# Patient Record
Sex: Female | Born: 1940 | Race: White | Hispanic: No | Marital: Married | Smoking: Former smoker
Health system: Southern US, Community
[De-identification: ages and names within clinical notes are randomized; demographics above are authoritative.]

## PROBLEM LIST (undated history)

## (undated) DIAGNOSIS — E785 Hyperlipidemia, unspecified: Secondary | ICD-10-CM

## (undated) DIAGNOSIS — K635 Polyp of colon: Secondary | ICD-10-CM

## (undated) DIAGNOSIS — Z90710 Acquired absence of both cervix and uterus: Secondary | ICD-10-CM

## (undated) DIAGNOSIS — I10 Essential (primary) hypertension: Secondary | ICD-10-CM

## (undated) DIAGNOSIS — M1990 Unspecified osteoarthritis, unspecified site: Secondary | ICD-10-CM

## (undated) HISTORY — PX: COLONOSCOPY: SHX174

## (undated) HISTORY — DX: Acquired absence of both cervix and uterus: Z90.710

## (undated) HISTORY — DX: Hyperlipidemia, unspecified: E78.5

## (undated) HISTORY — PX: ABDOMINAL HYSTERECTOMY: SHX81

## (undated) HISTORY — PX: TONSILLECTOMY: SUR1361

## (undated) HISTORY — PX: MOHS SURGERY: SUR867

## (undated) HISTORY — DX: Essential (primary) hypertension: I10

---

## 1999-08-30 ENCOUNTER — Encounter: Admission: RE | Admit: 1999-08-30 | Discharge: 1999-11-28 | Payer: Self-pay | Admitting: Family Medicine

## 1999-09-07 ENCOUNTER — Encounter: Payer: Self-pay | Admitting: Family Medicine

## 1999-09-07 ENCOUNTER — Encounter: Admission: RE | Admit: 1999-09-07 | Discharge: 1999-09-07 | Payer: Self-pay | Admitting: Family Medicine

## 2000-09-26 ENCOUNTER — Encounter: Payer: Self-pay | Admitting: Family Medicine

## 2000-09-26 ENCOUNTER — Encounter: Admission: RE | Admit: 2000-09-26 | Discharge: 2000-09-26 | Payer: Self-pay | Admitting: Family Medicine

## 2001-09-29 ENCOUNTER — Encounter: Payer: Self-pay | Admitting: Family Medicine

## 2001-09-29 ENCOUNTER — Encounter: Admission: RE | Admit: 2001-09-29 | Discharge: 2001-09-29 | Payer: Self-pay | Admitting: Family Medicine

## 2002-10-09 ENCOUNTER — Encounter: Payer: Self-pay | Admitting: Family Medicine

## 2002-10-09 ENCOUNTER — Encounter: Admission: RE | Admit: 2002-10-09 | Discharge: 2002-10-09 | Payer: Self-pay | Admitting: Family Medicine

## 2003-06-18 ENCOUNTER — Ambulatory Visit (HOSPITAL_COMMUNITY): Admission: RE | Admit: 2003-06-18 | Discharge: 2003-06-18 | Payer: Self-pay | Admitting: Family Medicine

## 2003-10-22 ENCOUNTER — Encounter: Admission: RE | Admit: 2003-10-22 | Discharge: 2003-10-22 | Payer: Self-pay | Admitting: Family Medicine

## 2004-12-14 ENCOUNTER — Encounter: Admission: RE | Admit: 2004-12-14 | Discharge: 2004-12-14 | Payer: Self-pay | Admitting: Family Medicine

## 2006-01-03 ENCOUNTER — Encounter: Admission: RE | Admit: 2006-01-03 | Discharge: 2006-01-03 | Payer: Self-pay | Admitting: Family Medicine

## 2007-03-18 ENCOUNTER — Encounter: Admission: RE | Admit: 2007-03-18 | Discharge: 2007-03-18 | Payer: Self-pay | Admitting: Family Medicine

## 2008-05-04 ENCOUNTER — Encounter: Admission: RE | Admit: 2008-05-04 | Discharge: 2008-05-04 | Payer: Self-pay | Admitting: Family Medicine

## 2009-05-11 ENCOUNTER — Encounter: Admission: RE | Admit: 2009-05-11 | Discharge: 2009-05-11 | Payer: Self-pay | Admitting: Family Medicine

## 2010-06-12 ENCOUNTER — Other Ambulatory Visit: Payer: Self-pay | Admitting: Family Medicine

## 2010-06-12 DIAGNOSIS — Z1231 Encounter for screening mammogram for malignant neoplasm of breast: Secondary | ICD-10-CM

## 2010-06-12 DIAGNOSIS — Z1239 Encounter for other screening for malignant neoplasm of breast: Secondary | ICD-10-CM

## 2010-06-26 ENCOUNTER — Ambulatory Visit
Admission: RE | Admit: 2010-06-26 | Discharge: 2010-06-26 | Disposition: A | Discharge status: Home / Self Care | Payer: Medicare Other | Source: Ambulatory Visit | Attending: Family Medicine | Admitting: Family Medicine

## 2010-06-26 DIAGNOSIS — Z1231 Encounter for screening mammogram for malignant neoplasm of breast: Secondary | ICD-10-CM

## 2010-08-07 ENCOUNTER — Other Ambulatory Visit: Payer: Self-pay | Admitting: Dermatology

## 2011-08-28 ENCOUNTER — Other Ambulatory Visit: Payer: Self-pay | Admitting: Family Medicine

## 2011-08-28 DIAGNOSIS — Z1231 Encounter for screening mammogram for malignant neoplasm of breast: Secondary | ICD-10-CM

## 2011-09-12 ENCOUNTER — Ambulatory Visit
Admission: RE | Admit: 2011-09-12 | Discharge: 2011-09-12 | Disposition: A | Discharge status: Home / Self Care | Payer: Medicare Other | Source: Ambulatory Visit | Attending: Family Medicine | Admitting: Family Medicine

## 2011-09-12 DIAGNOSIS — Z1231 Encounter for screening mammogram for malignant neoplasm of breast: Secondary | ICD-10-CM

## 2011-09-13 ENCOUNTER — Other Ambulatory Visit: Payer: Self-pay | Admitting: Family Medicine

## 2011-09-13 DIAGNOSIS — R928 Other abnormal and inconclusive findings on diagnostic imaging of breast: Secondary | ICD-10-CM

## 2011-09-19 ENCOUNTER — Ambulatory Visit
Admission: RE | Admit: 2011-09-19 | Discharge: 2011-09-19 | Disposition: A | Discharge status: Home / Self Care | Payer: Medicare Other | Source: Ambulatory Visit | Attending: Family Medicine | Admitting: Family Medicine

## 2011-09-19 DIAGNOSIS — R928 Other abnormal and inconclusive findings on diagnostic imaging of breast: Secondary | ICD-10-CM

## 2011-12-05 ENCOUNTER — Encounter: Payer: Self-pay | Admitting: Sports Medicine

## 2011-12-05 ENCOUNTER — Ambulatory Visit (INDEPENDENT_AMBULATORY_CARE_PROVIDER_SITE_OTHER): Payer: Medicare Other | Admitting: Sports Medicine

## 2011-12-05 VITALS — BP 145/89 | Ht 66.0 in | Wt 178.0 lb

## 2011-12-05 DIAGNOSIS — M25511 Pain in right shoulder: Secondary | ICD-10-CM | POA: Insufficient documentation

## 2011-12-05 DIAGNOSIS — M7531 Calcific tendinitis of right shoulder: Secondary | ICD-10-CM | POA: Insufficient documentation

## 2011-12-05 DIAGNOSIS — M7530 Calcific tendinitis of unspecified shoulder: Secondary | ICD-10-CM

## 2011-12-05 DIAGNOSIS — M25519 Pain in unspecified shoulder: Secondary | ICD-10-CM

## 2011-12-05 MED ORDER — NITROGLYCERIN 0.2 MG/HR TD PT24: MEDICATED_PATCH | TRANSDERMAL | Status: DC

## 2011-12-05 NOTE — Assessment & Plan Note (Signed)
Patient clinically as well as diagnostically had signs of rotator cuff syndrome. Likely this is from the calcific changes of the supraspinatus and infraspinatus tendon. At this point patient is going to be given a home exercise program to try to strengthen the rotator cuff and increase range of motion. In addition patient will start nitroglycerin patch protocol to see if we can help with the small tear that is seen in the supraspinatus tendon. Patient will continue these in followup in 4-6 weeks for reevaluation. At that time we will try to rescan and make sure that there's some improvement. On physical exam patient was minorly week of the right side but overall had good range of motion. Hopefully at followup will show improvement in strength.

## 2011-12-05 NOTE — Progress Notes (Signed)
Patient ID: Tamara Anthony, female   DOB: Sep 26, 1940, 71 y.o.   MRN: [id number]  Subjective: 71 year old female coming in with right shoulder pain. This is a new patient visit. Patient states approximately 6 months ago she has noticed that she's had some right shoulder pain does seem to be worse at night. Stated that he used to wake her up when she would lay on that side. During the course of these recent months the pain seems to have progressed where it does catheter from time to time with certain movements as well. Patient denies having any weakness. Patient also denies having any type of injury that she can think of that has caused this. Patient also denies any history of any problems with the shoulder previously. Patient denies any radiation of the pain but states that the pain is deep when she does have it and has not taken any over the counter medications. Patient saw her primary care provider who did get x-ray films that were reviewed by me today. Patient states her primary care provider said there was a abnormality in that is why she was sent here today to be seen. Patient does play softball but pictures underhand most of the time as well as plays pickle ball but she is still able to do these activities.  Review of systems  Patient denies any fevers or chills has lost weight which she has been trying to do. Denies any swelling instability or stiffness of the shoulder, patient denies dizziness any neck pain shortness of breath or chest pain. Otherwise 14 point system review of systems is unremarkable.  Past Medical History  Diagnosis Date  . Diabetes mellitus   . Hypertension   . Hyperlipidemia    History reviewed. No pertinent past surgical history. History   Social History  . Marital Status: Married    Spouse Name: N/A    Number of Children: N/A  . Years of Education: N/A   Occupational History  . Not on file.   Social History Main Topics  . Smoking status: Never Smoker   . Smokeless  tobacco: Not on file  . Alcohol Use: 1.2 oz/week    2 Glasses of wine per week  . Drug Use: No  . Sexually Active: No   Other Topics Concern  . Not on file   Social History Narrative  . No narrative on file   Family History  Problem Relation Age of Onset  . Diabetes Mother   . Heart attack Mother   . Hyperlipidemia Mother   . Hypertension Mother   . Hypertension Father      physical exam: Filed Vitals:   12/05/11 0959  BP: 145/89   General: No apparent distress healthy female Patient is alert and oriented x3. Patient's mood is normal. Shoulder:right  Inspection reveals no abnormalities, atrophy or asymmetry. Palpation is normal with mild tenderness over AC joint but no TTP in bicipital groove. ROM is full in all planes. Rotator cuff strength weak but bilateral mostly supraspinatus on right No signs of impingement with negative Neer and Hawkin's tests, empty can. Speeds and Yergason's tests normal. No labral pathology noted with negative Obrien's, negative clunk and good stability. Normal scapular function observed. Mild painful arc but no drop arm sign. No apprehension sign   Radiology: Images were reviewed by me today. Patient does have an area of calcification that is superior and lateral to the acromion on the right shoulder appears to be calcium deposits in likely the supraspinatus tendon. Patient  shoulder though appears unremarkable with no signs of fracture or any gapping or arthritic changes.  Ultrasound: Ultrasound today did show that patient has significant amount of calcium deposits in the supraspinatus as well as infraspinatus tendon today. Patient also has some mild tearing still in the supraspinatus. Patient's infraspinatus appears to be intact. Patient's teres minor was unremarkable and patient subscapularis was unremarkable. Patient's bicep tendon did have some mild hypoechoic changes at the proximal area but did not show any signs of tearing.

## 2011-12-05 NOTE — Patient Instructions (Addendum)
Very nice to meet you You do have a tear in your rotator cuff with some calcium deposits.  Continue on your weight loss and control your diabetes.  We will start you on a rehab program. Only start with 10 reps of the exercises.  We will also start on nitro patch.   Wear 1/2 patch daily it may give you a headache.  We want to see you again in 6 weeks and we will rescan the tendon to make sure it is improving as well.

## 2012-01-16 ENCOUNTER — Ambulatory Visit (INDEPENDENT_AMBULATORY_CARE_PROVIDER_SITE_OTHER): Payer: Medicare Other | Admitting: Sports Medicine

## 2012-01-16 VITALS — BP 130/70 | Ht 66.0 in | Wt 175.0 lb

## 2012-01-16 DIAGNOSIS — M25519 Pain in unspecified shoulder: Secondary | ICD-10-CM

## 2012-01-16 DIAGNOSIS — M25511 Pain in right shoulder: Secondary | ICD-10-CM

## 2012-01-16 DIAGNOSIS — M7531 Calcific tendinitis of right shoulder: Secondary | ICD-10-CM

## 2012-01-16 DIAGNOSIS — M7530 Calcific tendinitis of unspecified shoulder: Secondary | ICD-10-CM

## 2012-01-16 NOTE — Patient Instructions (Addendum)
Thank you for coming in today, it was good to see you Continue your theraband exercises-start to do overhead exercises as well with the theraband Also do the same exercises with a one pound weight.  You can increase this up to 5 lbs as tolerated Continue the nitroglycerin patches Follow up in 6 weeks

## 2012-01-16 NOTE — Assessment & Plan Note (Signed)
Significantly improved. Supraspinatus tear shows interval healing from previous scan. Doppler shows increase in vascular activity within the tendonand some decreased calcification. Given her improvement will have her continue theraband and increase exercise to overhead exercise and add one pound weight to begin with, not to exceed 5 pounds for exercises She'll continue nitroglycerin patches Followup in 6 weeks

## 2012-01-16 NOTE — Progress Notes (Signed)
  Subjective:    Patient ID: Tamara Anthony, female    DOB: 05-29-1940, 71 y.o.   MRN: [id number]  HPI  1. Shoulder pain: Patient here for followup of right shoulder pain. Pain is a 75-80% improved since previous visit. She is continuing to do exercises with theraband, and feels like this has helped significantly. Previously it was painful for her to sleep on her shoulder but this does not bother her anymore. She is minimal pain with use. She is also been using nitroglycerin patches for a small supraspinatus tear that was seen on ultrasound previously. She denies any decreased range of motion of the shoulder, neck pain, pain radiating into the arm, numbness, or tingling.  Review of Systems For history of present illness    Objective:   Physical Exam Shoulder: Inspection reveals no abnormalities, atrophy or asymmetry. Palpation is normal with no tenderness over AC joint or bicipital groove. ROM is full in all planes. Rotator cuff strength 4.5/5 with testing of supraspinatus otherwise normal throughout. No signs of impingement with negative Neer and Hawkin's tests, empty can. No labral pathology noted with negative Obrien's, negative clunk and good stability. Normal scapular function observed. No painful arc and no drop arm sign. No apprehension sign   MSK ultrasound: MSK ultrasound demonstrated interval healing of previous supraspinatus tear. There is still a small gap but this is improved from previous scan. Color Doppler shows large amount of vascular activity within the tendon.  there are still calcifications within the tendon although this looks mildly improved.subscapularis, infraspinatus and teres minor are intact.     Assessment & Plan:

## 2012-01-16 NOTE — Assessment & Plan Note (Signed)
Pain level is low enough that she is not even using OTC NSAIDs at this time

## 2012-01-17 ENCOUNTER — Ambulatory Visit (INDEPENDENT_AMBULATORY_CARE_PROVIDER_SITE_OTHER): Payer: Medicare Other | Admitting: Gynecology

## 2012-01-17 ENCOUNTER — Encounter: Payer: Self-pay | Admitting: Gynecology

## 2012-01-17 VITALS — BP 128/76 | Ht 65.0 in | Wt 176.0 lb

## 2012-01-17 DIAGNOSIS — N8110 Cystocele, unspecified: Secondary | ICD-10-CM

## 2012-01-17 DIAGNOSIS — N814 Uterovaginal prolapse, unspecified: Secondary | ICD-10-CM

## 2012-01-17 DIAGNOSIS — N812 Incomplete uterovaginal prolapse: Secondary | ICD-10-CM | POA: Insufficient documentation

## 2012-01-17 DIAGNOSIS — N8111 Cystocele, midline: Secondary | ICD-10-CM

## 2012-01-17 DIAGNOSIS — N952 Postmenopausal atrophic vaginitis: Secondary | ICD-10-CM | POA: Insufficient documentation

## 2012-01-17 MED ORDER — ESTRADIOL 0.1 MG/GM VA CREA: 2.0000 g | TOPICAL_CREAM | VAGINAL | Status: DC

## 2012-01-17 NOTE — Progress Notes (Signed)
Patient is a 71 year old gravida 2 para 2 (2 normal spontaneous vaginal deliveries largest baby weighed 8-1/2 pounds) who was referred to our practice as a courtesy of Dr. Delorse Lek at [facility] at [place] as a result of patient's pelvic organ prolapse. Her primary physician is treating her for hyperlipidemia, hypertension and type 2 diabetes. Patient's mammogram was in 2013. Her last colonoscopy was 3 years ago patient stated that she had several colon polyps removed. Her last bone density study was reported to be normal in 2008 and she is overdue for followup study.  Patient is very active in sports and is sexually active and recently she noticed a bulge in her vagina when she was showering . She denies any true stress urinary incontinence but states that if her bladder is completely full and if she is on her way to the bathroom she may leak a little bit. She does not have to use a pad on a daily basis Patient denies any vaginal atrophy. Patient denies any prior abdominal or pelvic surgery.  Exam: Abdomen: Soft nontender no rebound or guarding Pelvic: Bartholin urethra Skene was within normal limits Vagina: Mild vaginal atrophy second-degree cystocele with first-degree uterine prolapse noted on Valsalva maneuver. No evidence of rectocele. Bimanual exam: Uterus small axial when no palpable masses or tenderness. Adnexa: No palpable masses or tenderness Rectal: No rectocele good sphincter tone  Q-tip angle tests (to determine angulation of the urethral vesical angle to determine if there is hypermobility of the urethra) was less than 30 (normal)  On Valsalva maneuver in the supine and erect position patient did not leak urine even when the cystocele was reduced. There was no evidence of enterocele on rectovaginal exam.  Assessment/plan: First degree uterine prolapse with second degree cystocele. Patient will be scheduled to undergo urodynamic evaluation in the office and  then we will schedule a transvaginal hysterectomy with bilateral salpingo-oophorectomy along with anterior colporrhaphy. She will be started on Estrace vaginal cream to improve the vaginal epithelium to allow better tissue dissection at time of her surgery. She will start Estrace vaginal cream one applicator before bedtime for one week and then she will continue twice a week until at time of her surgery. Risks benefits and pros and cons were discussed. Patient denies any family history of breast cancer in her mammograms are up-to-date. Literature information on all the above was provided for the patient and will sit down and over her operation was the urodynamic studies have been completed here in the office.

## 2012-01-17 NOTE — Patient Instructions (Signed)
Prolapse  Prolapse means the falling down, bulging, dropping, or drooping of a body part. Organs that commonly prolapse include the rectum, small intestine, bladder, urethra, vagina (birth canal), uterus (womb), and cervix. Prolapse occurs when the ligaments and muscle tissue around the rectum, bladder, and uterus are damaged or weakened.  CAUSES  This happens especially with:  Childbirth. Some women feel pelvic pressure or have trouble holding their urine right after childbirth, because of stretching and tearing of pelvic tissues. This generally gets better with time and the feeling usually goes away, but it may return with aging.   Chronic heavy lifting.   Aging.   Menopause, with loss of estrogen production weakening the pelvic ligaments and muscles.   Past pelvic surgery.   Obesity.   Chronic constipation.   Chronic cough.  Prolapse may affect a single organ, or several organs may prolapse at the same time. The front wall of the vagina holds up the bladder. The back wall holds up part of the lower intestine, or rectum. The uterus fills a spot in the middle. All these organs can be involved when the ligaments and muscles around the vagina relax too much. This often gets worse when women stop producing estrogen (menopause). SYMPTOMS  Uncontrolled loss of urine (incontinence) with cough, sneeze, straining, and exercise.   More force may be required to have a bowel movement, due to trapping of the stool.   When part of an organ bulges through the opening of the vagina, there is sometimes a feeling of heaviness or pressure. It may feel as though something is falling out. This sensation increases with coughing or bearing down.   If the organs protrude through the opening of the vagina and rub against the clothing, there may be soreness, ulcers, infection, pain, and bleeding.   Lower back pain.   Pushing in the upper or lower part of the vagina, to pass urine or have a bowel movement.     Problems having sexual intercourse.   Being unable to insert a tampon or applicator.  DIAGNOSIS  Usually, a physical exam is all that is needed to identify the problem. During the examination, you may be asked to cough and strain while lying down, sitting up, and standing up. Your caregiver will determine if more testing is required, such as bladder function tests. Some diagnoses are:  Cystocele: Bulging and falling of the bladder into the top of the vagina.   Rectocele: Part of the rectum bulging into the vagina.   Prolapse of the uterus: The uterus falls or drops into the vagina.   Enterocele: Bulging of the top of the vagina, after a hysterectomy (uterus removal), with the small intestine bulging into the vagina. A hernia in the top of the vagina.   Urethrocele: The urethra (urine carrying tube) bulging into the vagina.  TREATMENT  In most cases, prolapse needs to be treated only if it produces symptoms. If the symptoms are interfering with your usual daily or sexual activities, treatment may be necessary. The following are some measures that may be used to treat prolapse.  Estrogen may help elderly women with mild prolapse.   Kegel exercises may help mild cases of prolapse, by strengthening and tightening the muscles of the pelvic floor.   Pessaries are used in women who choose not to, or are unable to, have surgery. A pessary is a doughnut-shaped piece of plastic or rubber that is put into the vagina to keep the organs in place. This device must   be fitted by your caregiver. Your caregiver will also explain how to care for yourself with the pessary. If it works well for you, this may be the only treatment required.   Surgery is often the only form of treatment for more severe prolapses. There are different types of surgery available. You should discuss what the best procedure is for you. If the uterus is prolapsed, it may be removed (hysterectomy) as part of the surgical treatment.  Your caregiver will discuss the risks and benefits with you.   Uterine-vaginal suspension (surgery to hold up the organs) may be used, especially if you want to maintain your fertility.  No form of treatment is guaranteed to correct the prolapse or relieve the symptoms. HOME CARE INSTRUCTIONS   Wear a sanitary pad or absorbent product if you have incontinence of urine.   Avoid heavy lifting and straining with exercise and work.   Take over-the-counter pain medicine for minor discomfort.   Try taking estrogen or using estrogen vaginal cream.   Try Kegel exercises or use a pessary, before deciding to have surgery.   Do Kegel exercises after having a baby.  SEEK MEDICAL CARE IF:   Your symptoms interfere with your daily activities.   You need medicine to help with the discomfort.   You need to be fitted with a pessary.   You notice bleeding from the vagina.   You think you have ulcers or you notice ulcers on the cervix.   You have an oral temperature above 102 F (38.9 C).   You develop pain or blood with urination.   You have bleeding with a bowel movement.   The symptoms are interfering with your sex life.   You have urinary incontinence that interferes with your daily activities.   You lose urine with sexual intercourse.   You have a chronic cough.   You have chronic constipation.  Document Released: 11/04/2002 Document Revised: 04/19/2011 Document Reviewed: 01/02/2                                                                                            Cystocele Repair  A cystocele is a bulging, drooping hernia or break (rupture) of bladder tissue into the birth canal (vagina). This bulging or rupture occurs on the top front wall of the vagina. CAUSES  Cystocele is associated with weakness of the top front wall of the vagina due to stretching and tearing of the ligaments and muscles in the area. This is often the result of:  Multiple childbirths.    Continuous heavy lifting.   Chronic cough from asthma, emphysema, or smoking.   Being overweight.   Changes from aging.   Previous surgery in the vaginal area.   Menopause with loss of estrogen hormone and weakening of the ligaments and muscles around the bladder.  SYMPTOMS   Uncontrolled loss of urine (incontinence) with cough, sneeze, or exercise.   Pelvic pressure.   Frequency or urgency to urinate because of inability to completely empty the bladder.   Bladder infections.   Needing to push on the upper vagina to help yourself pass urine.  DIAGNOSIS  A cystocele can be diagnosed by doing a pelvic exam and observing the top of the vagina drooping or bulging into or out of the vagina. TREATMENT  Surgical options:  Cystocele repair is surgery that removes the hernia.   There are also different "sling" operations that may be used.  Discuss the different types of surgeries to repair a cystocele with your caregiver. Your caregiver will decide what type of surgery will be best in your case. Nonsurgical options:  Kegel exercises. This helps strengthen and tighten the muscles and tissue in and around the bladder and vagina. This may help with mild cases of cystocele.   A pessary may help the cystocele. A pessary is a plastic or rubber device that lifts the bladder into place. A pessary must be fitted by a doctor.   Tampons or diaphragms that lift the bladder into place are sometimes helpful with a minor or small cystocele.   Estrogen may help with mild cases in menopausal and aging women.  LET YOUR CAREGIVER KNOW ABOUT:   Allergies to food or medicine.   Medicines taken, including vitamins, herbs, eyedrops, over-the-counter medicines, and creams.   Use of steroids (by mouth or creams).   Previous problems with anesthetics or numbing medicines.   History of bleeding problems or blood clots.   Previous surgery.   Other health problems, including diabetes and kidney  problems.   Possibility of pregnancy, if this applies.  RISKS AND COMPLICATIONS  All surgery is associated with risks.  There are risks with a general anesthesia. You should discuss this with your caregiver.   With spinal or epidural anesthesia, there may be an area that is not numbed, and you could feel pain.   Headache could occur with a spinal or epidural anesthetic.   The catheter you will have after surgery may not work properly or may get blocked and need to be replaced.   Excessive bleeding.   Infection.   Injury to surrounding structures.   Recurrence of the cystocele.   Surgery may not get rid of your symptoms.  BEFORE THE PROCEDURE   Do not take aspirin or blood thinners for 1 week prior to surgery, unless instructed otherwise.   Do not eat or drink anything after midnight the night before surgery.   Let your caregiver know if you develop a cold or other infectious problems prior to surgery.   If being admitted the day of surgery, you should be present 1 hour prior to your procedure or as directed by your caregiver.   Plan and arrange for help when you go home from the hospital.   If you smoke, do not smoke for at least 2 weeks before the surgery.   Do not drink any alcohol for 3 days before the surgery.  PROCEDURE  You will be given an anesthetic to prevent you from feeling pain during surgery. This may be a general anesthetic that puts you to sleep, or a spinal or epidural anesthetic. You will be asleep or be numbed through the entire procedure. During cystocele repair, tissue is pulled from the sides and around the top of the vagina to lift up the hernia. This removes the hernia so that the top of the vagina does not fall into the opening of the vagina. AFTER THE PROCEDURE  After surgery, you will be taken to the recovery room where a nurse will take care of you, checking your breathing, blood pressure, pulse, and your progress. When your caregiver feels you are  stable, you will be taken to your room. You will have a drainage tube (Foley catheter) that will drain your bladder for 2 to 7 days or longer, until your bladder is working properly. This catheter is placed prior to surgery to help keep your bladder empty and out of the way during the procedure. After surgery, this will make passing your urine easier. The catheter will be removed when you can easily pass urine without this assistance. You may have gauze packing in the vagina that will be removed 1 to 2 days after the surgery. Usually, you will be given a medicine (antibiotic) that kills germs. You will be given pain medicine as needed. You can usually go home in 3 to 5 days. HOME CARE INSTRUCTIONS   Do not take baths. Take showers until your caregiver informs you otherwise.   Take antibiotics as directed by your caregiver.   Exercise as instructed. Do not perform exercises which increase the pressure inside your belly (abdomen), such as sit-ups or lifting weights, until your caregiver has given permission. Walking exercise is preferred.   Only take over-the-counter or prescription medicines for pain and discomfort as directed by your caregiver.   Do not drink alcohol while taking pain medicine.   Do not lift anything over 5 pounds.   Do not drive until your caregiver gives you permission.   Get plenty of rest and sleep.   Have someone help with your household chores for 1 to 2 weeks.   If you develop constipation, you may take a mild laxative with your caregiver's permission. Eating bran foods and drinking enough water and fluids to keep your urine clear or pale yellow helps with constipation.   Do not take aspirin. It may cause bleeding.   You may resume normal diet and unstrenuous activities as directed.   Do not douche, use tampons, or engage in intercourse until your surgeon has given permission.   Change bandages (dressings) as directed.   Make and keep all your postoperative  appointments.  SEEK MEDICAL CARE IF:   You have abnormal vaginal discharge.   You develop a rash.   You are having a reaction to your medicine.   You develop nausea or vomiting.  SEEK IMMEDIATE MEDICAL CARE IF:   You have redness, swelling, or increasing pain in the vaginal area.   You notice pus coming from the vagina.   You have a fever.   You notice a bad smell coming from the vagina.   You have increasing abdominal pain.   You have frequent urination or you notice burning during urination.   You notice blood in your urine.   You have excessive vaginal bleeding.   You cannot urinate.  MAKE SURE YOU:   Understand these instructions.   Will watch your condition.   Will get help right away if you are not doing well or get worse.  Document Released: 04/27/2000 Document Revised: 04/19/2011 Document Reviewed: 07/28/2009 [facility] Patient Information 2012 [place], [place].  Hysterectomy Information  A hysterectomy is a procedure where your uterus is surgically removed. It will no longer be possible to have menstrual periods or to become pregnant. The tubes and ovaries can be removed (bilateral salpingo-oopherectomy) during this surgery as well.  REASONS FOR A HYSTERECTOMY  Persistent, abnormal bleeding.   Lasting (chronic) pelvic pain or infection.   The lining of the uterus (endometrium) starts growing outside the uterus (endometriosis).   The endometrium starts growing in the muscle of the uterus (adenomyosis).  The uterus falls down into the vagina (pelvic organ prolapse).   Symptomatic uterine fibroids.   Precancerous cells.   Cervical cancer or uterine cancer.  TYPES OF HYSTERECTOMIES  Supracervical hysterectomy. This type removes the top part of the uterus, but not the cervix.   Total hysterectomy. This type removes the uterus and cervix.   Radical hysterectomy. This type removes the uterus, cervix, and the fibrous tissue that holds the uterus in place  in the pelvis (parametrium).  WAYS A HYSTERECTOMY CAN BE PERFORMED  Abdominal hysterectomy. A large surgical cut (incision) is made in the abdomen. The uterus is removed through this incision.   Vaginal hysterectomy. An incision is made in the vagina. The uterus is removed through this incision. There are no abdominal incisions.   Conventional laparoscopic hysterectomy. A thin, lighted tube with a camera (laparoscope) is inserted into 3 or 4 small incisions in the abdomen. The uterus is cut into small pieces. The small pieces are removed through the incisions, or they are removed through the vagina.   Laparoscopic assisted vaginal hysterectomy (LAVH). Three or four small incisions are made in the abdomen. Part of the surgery is performed laparoscopically and part vaginally. The uterus is removed through the vagina.   Robot-assisted laparoscopic hysterectomy. A laparoscope is inserted into 3 or 4 small incisions in the abdomen. A computer-controlled device is used to give the surgeon a 3D image. This allows for more precise movements of surgical instruments. The uterus is cut into small pieces and removed through the incisions or removed through the vagina.  RISKS OF HYSTERECTOMY   Bleeding and risk of blood transfusion. Tell your caregiver if you do not want to receive any blood products.   Blood clots in the legs or lung.   Infection.   Injury to surrounding organs.   Anesthesia problems or side effects.   Conversion to an abdominal hysterectomy.  WHAT TO EXPECT AFTER A HYSTERECTOMY  You will be given pain medicine.   You will need to have someone with you for the first 3 to 5 days after you go home.   You will need to follow up with your surgeon in 2 to 4 weeks after surgery to evaluate your progress.   You may have early menopause symptoms like hot flashes, night sweats, and insomnia.   If you had a hysterectomy for a problem that was not a cancer or a condition that could lead  to cancer, then you no longer need Pap tests. However, even if you no longer need a Pap test, a regular exam is a good idea to make sure no other problems are starting.  Document Released: 10/24/2000 Document Revised: 04/19/2011 Document Reviewed: 12/09/2010 [facility] Patient Information 2012 [place], [place].

## 2012-01-24 ENCOUNTER — Telehealth: Payer: Self-pay | Admitting: *Deleted

## 2012-01-24 NOTE — Telephone Encounter (Signed)
Pt is calling requesting another rx, her estrace 0.1mg  is too expensive at $148.00 per tube. Pt has 1 tube now, but cant afford it. She asked if another could be prescribed?

## 2012-01-24 NOTE — Telephone Encounter (Signed)
[facility] and [facility] the following prescription: (We will need to send him a written prescription)  Estradiol 0.02% 1 mL prefilled applicator to apply weekly for one week and then twice a week she will need a 30 day supply with one refill

## 2012-01-24 NOTE — Telephone Encounter (Signed)
[facility], pt informed as well.

## 2012-01-31 ENCOUNTER — Telehealth: Payer: Self-pay | Admitting: Gynecology

## 2012-01-31 NOTE — Telephone Encounter (Signed)
Patient called to schedule urodynamics.  Urodynamics appointment scheduled Monday, Sept 30 8:30am.  Instructed to have full bladder for procedure.  Sherrilyn Rist will mail her instruction sheet.

## 2012-02-11 ENCOUNTER — Ambulatory Visit (INDEPENDENT_AMBULATORY_CARE_PROVIDER_SITE_OTHER): Payer: Medicare Other | Admitting: *Deleted

## 2012-02-11 DIAGNOSIS — N814 Uterovaginal prolapse, unspecified: Secondary | ICD-10-CM

## 2012-02-11 DIAGNOSIS — N8111 Cystocele, midline: Secondary | ICD-10-CM

## 2012-02-13 ENCOUNTER — Encounter: Payer: Self-pay | Admitting: Gynecology

## 2012-02-18 ENCOUNTER — Encounter: Payer: Self-pay | Admitting: Gynecology

## 2012-02-18 ENCOUNTER — Other Ambulatory Visit: Payer: Self-pay | Admitting: Family Medicine

## 2012-02-18 ENCOUNTER — Ambulatory Visit (INDEPENDENT_AMBULATORY_CARE_PROVIDER_SITE_OTHER): Payer: Medicare Other | Admitting: Gynecology

## 2012-02-18 VITALS — BP 138/80

## 2012-02-18 DIAGNOSIS — N8111 Cystocele, midline: Secondary | ICD-10-CM

## 2012-02-18 DIAGNOSIS — Z01818 Encounter for other preprocedural examination: Secondary | ICD-10-CM

## 2012-02-18 DIAGNOSIS — N9489 Other specified conditions associated with female genital organs and menstrual cycle: Secondary | ICD-10-CM

## 2012-02-18 DIAGNOSIS — N8110 Cystocele, unspecified: Secondary | ICD-10-CM

## 2012-02-18 DIAGNOSIS — R922 Inconclusive mammogram: Secondary | ICD-10-CM

## 2012-02-18 DIAGNOSIS — I10 Essential (primary) hypertension: Secondary | ICD-10-CM | POA: Insufficient documentation

## 2012-02-18 DIAGNOSIS — E119 Type 2 diabetes mellitus without complications: Secondary | ICD-10-CM | POA: Insufficient documentation

## 2012-02-18 DIAGNOSIS — N949 Unspecified condition associated with female genital organs and menstrual cycle: Secondary | ICD-10-CM

## 2012-02-18 DIAGNOSIS — N814 Uterovaginal prolapse, unspecified: Secondary | ICD-10-CM

## 2012-02-18 NOTE — Patient Instructions (Addendum)
Hysterectomy Information   A hysterectomy is a procedure where your uterus is surgically removed. It will no longer be possible to have menstrual periods or to become pregnant. The tubes and ovaries can be removed (bilateral salpingo-oopherectomy) during this surgery as well.    REASONS FOR A HYSTERECTOMY  · Persistent, abnormal bleeding.  · Lasting (chronic) pelvic pain or infection.  · The lining of the uterus (endometrium) starts growing outside the uterus (endometriosis).  · The endometrium starts growing in the muscle of the uterus (adenomyosis).  · The uterus falls down into the vagina (pelvic organ prolapse).  · Symptomatic uterine fibroids.  · Precancerous cells.  · Cervical cancer or uterine cancer.  TYPES OF HYSTERECTOMIES  · Supracervical hysterectomy. This type removes the top part of the uterus, but not the cervix.  · Total hysterectomy. This type removes the uterus and cervix.  · Radical hysterectomy. This type removes the uterus, cervix, and the fibrous tissue that holds the uterus in place in the pelvis (parametrium).  WAYS A HYSTERECTOMY CAN BE PERFORMED  · Abdominal hysterectomy. A large surgical cut (incision) is made in the abdomen. The uterus is removed through this incision.  · Vaginal hysterectomy. An incision is made in the vagina. The uterus is removed through this incision. There are no abdominal incisions.  · Conventional laparoscopic hysterectomy. A thin, lighted tube with a camera (laparoscope) is inserted into 3 or 4 small incisions in the abdomen. The uterus is cut into small pieces. The small pieces are removed through the incisions, or they are removed through the vagina.  · Laparoscopic assisted vaginal hysterectomy (LAVH). Three or four small incisions are made in the abdomen. Part of the surgery is performed laparoscopically and part vaginally. The uterus is removed through the vagina.  · Robot-assisted laparoscopic hysterectomy. A laparoscope is inserted into 3 or 4 small  incisions in the abdomen. A computer-controlled device is used to give the surgeon a 3D image. This allows for more precise movements of surgical instruments. The uterus is cut into small pieces and removed through the incisions or removed through the vagina.  RISKS OF HYSTERECTOMY    · Bleeding and risk of blood transfusion. Tell your caregiver if you do not want to receive any blood products.  · Blood clots in the legs or lung.  · Infection.  · Injury to surrounding organs.  · Anesthesia problems or side effects.  · Conversion to an abdominal hysterectomy.  WHAT TO EXPECT AFTER A HYSTERECTOMY  · You will be given pain medicine.  · You will need to have someone with you for the first 3 to 5 days after you go home.  · You will need to follow up with your surgeon in 2 to 4 weeks after surgery to evaluate your progress.  · You may have early menopause symptoms like hot flashes, night sweats, and insomnia.  · If you had a hysterectomy for a problem that was not a cancer or a condition that could lead to cancer, then you no longer need Pap tests. However, even if you no longer need a Pap test, a regular exam is a good idea to make sure no other problems are starting.  Document Released: 10/24/2000 Document Revised: 07/23/2011 Document Reviewed: 12/09/2010  [employer]® Patient Information ©2013 [employer].

## 2012-02-18 NOTE — Progress Notes (Signed)
Tamara Anthony Patient is a 71 year old gravida 2 para 2 (2 normal spontaneous vaginal deliveries largest baby weighed 8-1/2 pounds) who is here for preoperative consultation who had been referred to our practice as a courtesy of Dr. Delorse Lek at [facility] as a result of patient's pelvic organ prolapse.Patient is very active in sports and is sexually active and recently she noticed a bulge in her vagina when she was showering . She denies any true stress urinary incontinence but states that if her bladder is completely full and if she is on her way to the bathroom she may leak a little bit. She does not have to use a pad on a daily basis Patient denies any vaginal atrophy. Patient denies any prior abdominal or pelvic surgery.  On last office visit during Valsalva maneuver in the supine and erect position patient did not leak urine even when the cystocele was reduced. There was no evidence of enterocele on rectovaginal exam .Q-tip angle tests (to determine angulation of the urethral vesical angle to determine if there is hypermobility of the urethra) was less than 30 (normal)   Her urodynamic evaluation was normal recently UPPP was less than 30. After bladder was completely full and bladder was displaced patient did not leak. Case was discussed with the urologist Dr. Patsi Sears and he did not feel that the patient needed a sling procedure at time of her planned t vaginal hysterectomy and anterior colporrhaphy.   Pertinent Gynecological History: Menses: post-menopausal Bleeding: Post menopausal no bleeding Contraception: none DES exposure: denies Blood transfusions: none Sexually transmitted diseases: no past history Previous GYN Procedures: Vaginal deliveries  Last mammogram: Additional views requested Date: 2013 in progress Last pap: normal Date: ? OB History: G2, P2   Menstrual History: Menarche age: 40 No LMP recorded. Patient is postmenopausal.    Past  Medical History  Diagnosis Date  . Diabetes mellitus   . Hypertension   . Hyperlipidemia     History reviewed. No pertinent past surgical history.  Family History  Problem Relation Age of Onset  . Diabetes Mother   . Heart attack Mother   . Hyperlipidemia Mother   . Hypertension Mother   . Hypertension Father     Social History:  reports that she has never smoked. She has never used smokeless tobacco. She reports that she drinks about 1.2 ounces of alcohol per week. She reports that she does not use illicit drugs.  Allergies: No Known Allergies   (Not in a hospital admission)  REVIEW OF SYSTEMS: A ROS was performed and pertinent positives and negatives are included in the history.  GENERAL: No fevers or chills. HEENT: No change in vision, no earache, sore throat or sinus congestion. NECK: No pain or stiffness. CARDIOVASCULAR: No chest pain or pressure. No palpitations. PULMONARY: No shortness of breath, cough or wheeze. GASTROINTESTINAL: No abdominal pain, nausea, vomiting or diarrhea, melena or bright red blood per rectum. GENITOURINARY: No urinary frequency, urgency, hesitancy or dysuria. MUSCULOSKELETAL: No joint or muscle pain, no back pain, no recent trauma. DERMATOLOGIC: No rash, no itching, no lesions. ENDOCRINE: No polyuria, polydipsia, no heat or cold intolerance. No recent change in weight. HEMATOLOGICAL: No anemia or easy bruising or bleeding. NEUROLOGIC: No headache, seizures, numbness, tingling or weakness. PSYCHIATRIC: No depression, no loss of interest in normal activity or change in sleep pattern.     Blood pressure 138/80.  Physical Exam:  HEENT:unremarkable Neck:Supple, midline, no thyroid megaly, no carotid bruits Lungs:  Clear  to auscultation no rhonchi's or wheezes Heart:Regular rate and rhythm, no murmurs or gallops Breast Exam: Not examined Abdomen: Soft nontender no rebound or guarding within normal limits Pelvic:BUS Vagina: No lesions or discharge  well estrogenized Cervix: No lesions or discharge Uterus: Axial nontender Adnexa: Questionable left adnexal fullness Extremities: No cords, no edema Rectal: Not examined  No results found for this or any previous visit (from the past 24 hour(s)).  No results found.  Assessment/Plan: Postmenopausal patient with symptomatic first degree uterine prolapse and second-degree cystocele with no stress urinary incontinence. Normal urodynamic evaluation. Patient will be scheduled to undergo transvaginal hysterectomy with possible bilateral salpingo-oophorectomy and anterior colporrhaphy. We'll need medical clearance from her primary physician since patient has history of hypertension and type 2 diabetes. She will return to the office next week for an ultrasound for better assessment of her left adnexa. Patient had been placed on Estrace vaginal cream to improve vaginal epithelialization to facilitate the dissection during her surgery. She will continue to apply the estrogen vaginal cream twice a week until her surgery. Patient will followup with a recommended mammogram 6 months from her study done in May in reference to small oily cyst noted on her left breast post trauma. The following risk for her surgery were discussed:                        Patient was counseled as to the risk of surgery to include the following:  1. Infection (prohylactic antibiotics will be administered)  2. DVT/Pulmonary Embolism (prophylactic pneumo compression stockings will be used)  3.Trauma to internal organs requiring additional surgical procedure to repair any injury to     Internal organs requiring perhaps additional hospitalization days.  4.Hemmorhage requiring transfusion and blood products which carry risks such as  anaphylactic reaction, hepatitis and AIDS  Patient had received literature information on the procedure scheduled and all her questions were answeredand accepts all risk.  [facility] HMD12:05 PMTD@   FERNANDEZ,JUAN H 02/18/2012, 11:53 AM

## 2012-02-19 ENCOUNTER — Other Ambulatory Visit: Payer: Self-pay | Admitting: Family Medicine

## 2012-02-19 DIAGNOSIS — M8580 Other specified disorders of bone density and structure, unspecified site: Secondary | ICD-10-CM

## 2012-02-25 ENCOUNTER — Ambulatory Visit (INDEPENDENT_AMBULATORY_CARE_PROVIDER_SITE_OTHER): Payer: Medicare Other | Admitting: Gynecology

## 2012-02-25 ENCOUNTER — Other Ambulatory Visit: Payer: Self-pay | Admitting: Gynecology

## 2012-02-25 ENCOUNTER — Encounter: Payer: Self-pay | Admitting: Gynecology

## 2012-02-25 ENCOUNTER — Ambulatory Visit (INDEPENDENT_AMBULATORY_CARE_PROVIDER_SITE_OTHER): Payer: Medicare Other

## 2012-02-25 DIAGNOSIS — N9489 Other specified conditions associated with female genital organs and menstrual cycle: Secondary | ICD-10-CM

## 2012-02-25 DIAGNOSIS — N858 Other specified noninflammatory disorders of uterus: Secondary | ICD-10-CM

## 2012-02-25 DIAGNOSIS — N814 Uterovaginal prolapse, unspecified: Secondary | ICD-10-CM

## 2012-02-25 DIAGNOSIS — N854 Malposition of uterus: Secondary | ICD-10-CM

## 2012-02-25 DIAGNOSIS — N8110 Cystocele, unspecified: Secondary | ICD-10-CM

## 2012-02-25 DIAGNOSIS — N8111 Cystocele, midline: Secondary | ICD-10-CM

## 2012-02-25 DIAGNOSIS — N83339 Acquired atrophy of ovary and fallopian tube, unspecified side: Secondary | ICD-10-CM

## 2012-02-25 DIAGNOSIS — N949 Unspecified condition associated with female genital organs and menstrual cycle: Secondary | ICD-10-CM

## 2012-02-25 NOTE — Patient Instructions (Addendum)
Tamara Anthony we'll be calling you this week to schedule your surgery. Stay with the estrogen cream twice a week as we discussed before.

## 2012-02-25 NOTE — Progress Notes (Signed)
Tamara Anthony Patient is a 71-year-old gravida 2 para 2 (2 normal spontaneous vaginal deliveries largest baby weighed 8-1/2 pounds) who is here for preoperative consultation who had been referred to our practice as a courtesy of Dr. Brent A. Burnett at [facility] [place] as a result of patient's pelvic organ prolapse.Patient is very active in sports and is sexually active and recently she noticed a bulge in her vagina when she was showering . She denies any true stress urinary incontinence but states that if her bladder is completely full and if she is on her way to the bathroom she may leak a little bit. She does not have to use a pad on a daily basis Patient denies any vaginal atrophy. Patient denies any prior abdominal or pelvic surgery.   On last office visit during Valsalva maneuver in the supine and erect position patient did not leak urine even when the cystocele was reduced. There was no evidence of enterocele on rectovaginal exam .Q-tip angle tests (to determine angulation of the urethral vesical angle to determine if there is hypermobility of the urethra) was less than 30 (normal)  Her urodynamic evaluation was normal recently UPPP was less than 30. After bladder was completely full and bladder was displaced patient did not leak. Case was discussed with the urologist Dr. Tannenbaum and he did not feel that the patient needed a sling procedure at time of her planned t vaginal hysterectomy and anterior colporrhaphy.   During preop visit recently there was a question of fullness in her left adnexa and for this reason she was asked to return to the office today for an ultrasound. Results as follows: Uterus measures 6.7 x 5.3 x 3.1 cm endometrium 10.6 mm. A sonohysterogram was then undertaken and a cystic solid area with a theater vessel was seen going to the mass. Both right and left ovary were atrophic. There was no fluid in the cul-de-sac. Sonohysterogram defects seen in the  endometrial cavity cystic solid measuring 26 x 10 x 26 mm. An endometrial biopsy was obtained results pending at time of this dictation.  Pertinent Gynecological History:  Menses: post-menopausal  Bleeding: Post menopausal no bleeding  Contraception: none  DES exposure: denies  Blood transfusions: none  Sexually transmitted diseases: no past history  Previous GYN Procedures: Vaginal deliveries  Last mammogram: Additional views requested Date: 2013 in progress  Last pap: normal Date: ?  OB History: G2, P2  Menstrual History:  Menarche age: 13  No LMP recorded. Patient is postmenopausal.   Past Medical History   Diagnosis  Date   .  Diabetes mellitus    .  Hypertension    .  Hyperlipidemia     History reviewed. No pertinent past surgical history.  Family History   Problem  Relation  Age of Onset   .  Diabetes  Mother    .  Heart attack  Mother    .  Hyperlipidemia  Mother    .  Hypertension  Mother    .  Hypertension  Father     Social History: reports that she has never smoked. She has never used smokeless tobacco. She reports that she drinks about 1.2 ounces of alcohol per week. She reports that she does not use illicit drugs.  Allergies: No Known Allergies   (Not in a hospital admission)  REVIEW OF SYSTEMS: A ROS was performed and pertinent positives and negatives are included in the history.  GENERAL: No fevers or chills. HEENT: No   change in vision, no earache, sore throat or sinus congestion. NECK: No pain or stiffness. CARDIOVASCULAR: No chest pain or pressure. No palpitations. PULMONARY: No shortness of breath, cough or wheeze. GASTROINTESTINAL: No abdominal pain, nausea, vomiting or diarrhea, melena or bright red blood per rectum. GENITOURINARY: No urinary frequency, urgency, hesitancy or dysuria. MUSCULOSKELETAL: No joint or muscle pain, no back pain, no recent trauma. DERMATOLOGIC: No rash, no itching, no lesions. ENDOCRINE: No polyuria, polydipsia, no heat or cold  intolerance. No recent change in weight. HEMATOLOGICAL: No anemia or easy bruising or bleeding. NEUROLOGIC: No headache, seizures, numbness, tingling or weakness. PSYCHIATRIC: No depression, no loss of interest in normal activity or change in sleep pattern.  Blood pressure 138/80.  Physical Exam:  HEENT:unremarkable  Neck:Supple, midline, no thyroid megaly, no carotid bruits  Lungs: Clear to auscultation no rhonchi's or wheezes  Heart:Regular rate and rhythm, no murmurs or gallops  Breast Exam: Not examined  Abdomen: Soft nontender no rebound or guarding within normal limits  Pelvic:BUS  Vagina: No lesions or discharge well estrogenized  Cervix: No lesions or discharge  Uterus: Axial nontender  Adnexa: Questionable left adnexal fullness  Extremities: No cords, no edema  Rectal: Not examined  No results found for this or any previous visit (from the past 24 hour(s)).  No results found.  Assessment/Plan:  Postmenopausal patient with symptomatic first degree uterine prolapse and second-degree cystocele with no stress urinary incontinence. Normal urodynamic evaluation. Patient will be scheduled to undergo transvaginal hysterectomy with possible bilateral salpingo-oophorectomy and anterior colporrhaphy. We'll need medical clearance from her primary physician since patient has history of hypertension and type 2 diabetes.  Patient had been placed on Estrace vaginal cream to improve vaginal epithelialization to facilitate the dissection during her surgery. She will continue to apply the estrogen vaginal cream twice a week until her surgery. Patient will followup with a recommended mammogram 6 months from her study done in May in reference to small oily cyst noted on her left breast post trauma. The results of endometrial biopsy are pending at time of this dictation. This appears to be a cystic degeneration a year a polyp or a submucous myoma. Minimal amount of tissue was obtained the was submitted for  histological evaluation. The following risk for her surgery were discussed:   Patient was counseled as to the risk of surgery to include the following:  1. Infection (prohylactic antibiotics will be administered)  2. DVT/Pulmonary Embolism (prophylactic pneumo compression stockings will be used)  3.Trauma to internal organs requiring additional surgical procedure to repair any injury to  Internal organs requiring perhaps additional hospitalization days. If there is any injury to the bladder she may have to go home with a Foley catheter leg bag to allow healing. 4.Hemmorhage requiring transfusion and blood products which carry risks such as anaphylactic reaction, hepatitis and AIDS  5. We discussed in the event that this may be a form of uterine cancer at that we will have to wait for the final pathology report and then referred to oncologist if indicated. 6. We discussed that if her ovaries are too small or not accessible that we may either leave them or go in and remove them laparoscopically but more than likely we will leave them alone. Patient had received literature information on the procedure scheduled and all her questions were answeredand accepts all risk.       

## 2012-02-26 ENCOUNTER — Telehealth: Payer: Self-pay | Admitting: Gynecology

## 2012-02-26 NOTE — Telephone Encounter (Signed)
Patient returned my call. I reminded her that Dr. Glenetta Hew wants her to see Dr. Doristine Counter for medical clearance for surgery.  I told her we would like something in writing from Dr. Doristine Counter.  She said she will contact him.

## 2012-02-26 NOTE — Telephone Encounter (Signed)
OK thank you 

## 2012-02-26 NOTE — Telephone Encounter (Signed)
Patient called to tell me that she called Dr. Mellody Life office and the soonest they can see her is Oct 30 at 2:30pm.  They put her on a cancellation list and hopefully will be calling her with sooner appointment.

## 2012-02-26 NOTE — Telephone Encounter (Signed)
Her surgery is not until Nov 7th and the appointment with Dr. Doristine Counter is the week before surgery on Oct 30 . Hopefully they will get a cancellation and get her in even earlier.

## 2012-02-26 NOTE — Telephone Encounter (Signed)
Left message for patient to call me. I had previously left her a message with her surgery date and asked her to call me and she has not yet returned my call.  I need to remind her that she needs to get medical clearance for her surgery with Dr. Doristine Counter.

## 2012-02-26 NOTE — Telephone Encounter (Signed)
Hopefully she'll be in before surgery I hope that you to informed  her that this was preoperative since she has a planned scheduled surgical date. Thank you

## 2012-02-27 ENCOUNTER — Ambulatory Visit: Payer: Medicare Other | Admitting: Sports Medicine

## 2012-02-27 ENCOUNTER — Telehealth: Payer: Self-pay | Admitting: *Deleted

## 2012-02-27 NOTE — Telephone Encounter (Signed)
Pt called c/o vaginal bleeding due to endometrial Bx taken on 02/25/12.  Pt said just spotting I told pt this was normal after having procedure done. Pt was okay with this and will follow up if bleeding should become heavy.

## 2012-02-28 ENCOUNTER — Ambulatory Visit (INDEPENDENT_AMBULATORY_CARE_PROVIDER_SITE_OTHER): Payer: Medicare Other | Admitting: Sports Medicine

## 2012-02-28 VITALS — BP 134/80 | Ht 66.0 in | Wt 175.0 lb

## 2012-02-28 DIAGNOSIS — M7530 Calcific tendinitis of unspecified shoulder: Secondary | ICD-10-CM

## 2012-02-28 DIAGNOSIS — M25569 Pain in unspecified knee: Secondary | ICD-10-CM

## 2012-02-28 DIAGNOSIS — M7531 Calcific tendinitis of right shoulder: Secondary | ICD-10-CM

## 2012-02-28 NOTE — Progress Notes (Signed)
Tamara Anthony is a 71 y.o. female who presents to [facility] today for followup right shoulder and to discuss her right knee pain.  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  Pain completely resolved.  Tolerated nitroglycerin well.  Continuing exercises.  Quite satisfied with the outcome. No weakness numbness radiating pain.   2) right knee pain: Patient has posterior knee pain and stiffness first thing in the morning. Her pain and stiffness improve after 15 minutes or so. She denies any significant knee swelling.  This has been evaluated in the past. 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 She has not used this because is uncomfortable.   PMH reviewed.  History  Substance Use Topics  . Smoking status: Never Smoker   . Smokeless tobacco: Never Used  . Alcohol Use: 1.2 oz/week    2 Glasses of wine per week     OCC   ROS as above otherwise neg   Exam:  BP 134/80  Ht 5\' 6"  (1.676 m)  Wt 175 lb (79.379 kg)  BMI 28.25 kg/m2 Gen: Well NAD MSK: Right shoulder: Normal range of motion to abduction and forward flexion external and internal rotation Strength is 5/5 to abduction external and internal rotation Negative Hawkins and Neer test Negative O'Brien and empty can test  Right knee: No effusions prominence at the medial aspect of the knee.  Nontender the joint lines.  Range of motion 5-120 Strength intact to extension and flexion.  She has marked degenerative change based on the appearance.   Left Knee:  No effusions prominence at the medial aspect of the knee.  Nontender the joint lines.  Range of motion 5-120 Strength intact to extension and flexion.  Large palpable medial spurring and valgus shift noted.

## 2012-02-28 NOTE — Patient Instructions (Addendum)
Thank you for coming in today. Great job on your shoulder.  Try stopping the nitroglycerin patches. If your pain persists restart.  Continue the exercises.  Try the knee sleeve on your leg. Use especially with and 1 hour following exercise.  Do the knee exercises.  Come back as needed.  Large knee sleeve

## 2012-02-28 NOTE — Assessment & Plan Note (Addendum)
New problem today Likely related to DJD.  Trial of compression knee sleeve as well as strengthening exercises. Ice following exercise.  Tylenol or NSAIDs as needed.  Followup as needed

## 2012-02-28 NOTE — Assessment & Plan Note (Signed)
Chronic problem improved: Continue JOBE exercises Ween of nitroglycerin patches. Followup as needed

## 2012-03-06 ENCOUNTER — Encounter (HOSPITAL_COMMUNITY): Payer: Self-pay | Admitting: Pharmacist

## 2012-03-17 ENCOUNTER — Ambulatory Visit
Admission: RE | Admit: 2012-03-17 | Discharge: 2012-03-17 | Disposition: A | Discharge status: Home / Self Care | Payer: Medicare Other | Source: Ambulatory Visit | Attending: Family Medicine | Admitting: Family Medicine

## 2012-03-17 DIAGNOSIS — M8580 Other specified disorders of bone density and structure, unspecified site: Secondary | ICD-10-CM

## 2012-03-17 DIAGNOSIS — R922 Inconclusive mammogram: Secondary | ICD-10-CM

## 2012-03-18 ENCOUNTER — Other Ambulatory Visit: Payer: Self-pay

## 2012-03-18 ENCOUNTER — Encounter (HOSPITAL_COMMUNITY)
Admission: RE | Admit: 2012-03-18 | Discharge: 2012-03-18 | Disposition: A | Discharge status: Home / Self Care | Payer: Medicare Other | Source: Ambulatory Visit | Attending: Gynecology | Admitting: Gynecology

## 2012-03-18 ENCOUNTER — Encounter (HOSPITAL_COMMUNITY): Payer: Self-pay

## 2012-03-18 LAB — COMPREHENSIVE METABOLIC PANEL
ALT: 11 U/L (ref 0–35)
Albumin: 4 g/dL (ref 3.5–5.2)
Alkaline Phosphatase: 74 U/L (ref 39–117)
BUN: 21 mg/dL (ref 6–23)
Potassium: 3.8 mEq/L (ref 3.5–5.1)
Sodium: 142 mEq/L (ref 135–145)
Total Protein: 7.5 g/dL (ref 6.0–8.3)

## 2012-03-18 LAB — URINALYSIS, ROUTINE W REFLEX MICROSCOPIC
Glucose, UA: NEGATIVE mg/dL
Ketones, ur: NEGATIVE mg/dL
Leukocytes, UA: NEGATIVE
pH: 7 (ref 5.0–8.0)

## 2012-03-18 LAB — CBC
MCHC: 33.9 g/dL (ref 30.0–36.0)
RDW: 13.1 % (ref 11.5–15.5)

## 2012-03-18 LAB — SURGICAL PCR SCREEN: Staphylococcus aureus: POSITIVE — AB

## 2012-03-18 NOTE — Patient Instructions (Signed)
Your procedure is scheduled on:03/20/12  Enter through the Main Entrance at :6am Pick up desk phone and dial [phone] and inform us of your arrival.  Please call [phone] if you have any problems the morning of surgery.  Remember: HOLD METFORMIN FOR 24 HRS PRIOR TO SURGERY  Do not eat or drink after midnight:Wed   Take these meds the morning of surgery with a sip of water: Coreg, HCTZ  DO NOT wear jewelry, eye make-up, lipstick,body lotion, or dark fingernail polish. Do not shave for 48 hours prior to surgery.  If you are to be admitted after surgery, leave suitcase in car until your room has been assigned.

## 2012-03-19 ENCOUNTER — Encounter (HOSPITAL_COMMUNITY): Payer: Self-pay | Admitting: Anesthesiology

## 2012-03-19 NOTE — Anesthesia Preprocedure Evaluation (Addendum)
Anesthesia Evaluation  Patient identified by MRN, date of birth, ID band Patient awake    Reviewed: Allergy & Precautions, H&P , NPO status , Patient's Chart, lab work & pertinent test results, reviewed documented beta blocker date and time   Airway Mallampati: II TM Distance: >3 FB Neck ROM: Full    Dental  (+) Lower Dentures and Partial Upper   Pulmonary neg pulmonary ROS,  breath sounds clear to auscultation  Pulmonary exam normal       Cardiovascular hypertension, Pt. on medications and Pt. on home beta blockers Rhythm:Regular Rate:Normal     Neuro/Psych  Neuromuscular disease negative psych ROS   GI/Hepatic negative GI ROS, Neg liver ROS,   Endo/Other  diabetes, Well Controlled, Type 2, Oral Hypoglycemic Agents  Renal/GU negative Renal ROS   Cystocele Rectocele Uterine Prolapse    Musculoskeletal negative musculoskeletal ROS (+)   Abdominal   Peds  Hematology negative hematology ROS (+)   Anesthesia Other Findings   Reproductive/Obstetrics negative OB ROS                         Anesthesia Physical Anesthesia Plan  ASA: II  Anesthesia Plan: Spinal and Combined Spinal and Epidural   Post-op Pain Management:    Induction: Intravenous  Airway Management Planned: Oral ETT  Additional Equipment:   Intra-op Plan:   Post-operative Plan:   Informed Consent: I have reviewed the patients History and Physical, chart, labs and discussed the procedure including the risks, benefits and alternatives for the proposed anesthesia with the patient or authorized representative who has indicated his/her understanding and acceptance.   Dental advisory given  Plan Discussed with: Anesthesiologist, CRNA and Surgeon  Anesthesia Plan Comments:        Anesthesia Quick Evaluation

## 2012-03-20 ENCOUNTER — Encounter (HOSPITAL_COMMUNITY): Payer: Self-pay | Admitting: Anesthesiology

## 2012-03-20 ENCOUNTER — Encounter (HOSPITAL_COMMUNITY): Payer: Self-pay | Admitting: *Deleted

## 2012-03-20 ENCOUNTER — Encounter (HOSPITAL_COMMUNITY)
Admission: RE | Disposition: A | Discharge status: Home / Self Care | Payer: Self-pay | Source: Ambulatory Visit | Attending: Gynecology

## 2012-03-20 ENCOUNTER — Inpatient Hospital Stay (HOSPITAL_COMMUNITY): Payer: Medicare Other | Admitting: Anesthesiology

## 2012-03-20 ENCOUNTER — Ambulatory Visit (HOSPITAL_COMMUNITY)
Admission: RE | Admit: 2012-03-20 | Discharge: 2012-03-21 | Disposition: A | Discharge status: Home / Self Care | Payer: Medicare Other | Source: Ambulatory Visit | Attending: Gynecology | Admitting: Gynecology

## 2012-03-20 DIAGNOSIS — Z9889 Other specified postprocedural states: Secondary | ICD-10-CM

## 2012-03-20 DIAGNOSIS — N840 Polyp of corpus uteri: Secondary | ICD-10-CM | POA: Insufficient documentation

## 2012-03-20 DIAGNOSIS — N8111 Cystocele, midline: Secondary | ICD-10-CM

## 2012-03-20 DIAGNOSIS — Z01812 Encounter for preprocedural laboratory examination: Secondary | ICD-10-CM | POA: Insufficient documentation

## 2012-03-20 DIAGNOSIS — N812 Incomplete uterovaginal prolapse: Principal | ICD-10-CM | POA: Insufficient documentation

## 2012-03-20 DIAGNOSIS — N814 Uterovaginal prolapse, unspecified: Secondary | ICD-10-CM

## 2012-03-20 DIAGNOSIS — Z01818 Encounter for other preprocedural examination: Secondary | ICD-10-CM | POA: Insufficient documentation

## 2012-03-20 HISTORY — PX: CYSTOCELE REPAIR: SHX163

## 2012-03-20 HISTORY — PX: VAGINAL HYSTERECTOMY: SHX2639

## 2012-03-20 LAB — GLUCOSE, CAPILLARY: Glucose-Capillary: 63 mg/dL — ABNORMAL LOW (ref 70–99)

## 2012-03-20 SURGERY — HYSTERECTOMY, VAGINAL
Anesthesia: General | Site: Vagina | Wound class: Clean Contaminated

## 2012-03-20 MED ORDER — SODIUM CHLORIDE 0.9 % IJ SOLN: 3.0000 mL | INTRAMUSCULAR | Status: DC | PRN

## 2012-03-20 MED ORDER — METFORMIN HCL ER 500 MG PO TB24
500.0000 mg | ORAL_TABLET | Freq: Every day | ORAL | Status: DC
  Filled 2012-03-20 (×2): qty 1

## 2012-03-20 MED ORDER — OXYCODONE-ACETAMINOPHEN 5-325 MG PO TABS: 1.0000 | ORAL_TABLET | ORAL | Status: DC | PRN

## 2012-03-20 MED ORDER — LACTATED RINGERS IV SOLN
INTRAVENOUS | Status: DC
  Administered 2012-03-20: 20:00:00 via INTRAVENOUS

## 2012-03-20 MED ORDER — MEPERIDINE HCL 25 MG/ML IJ SOLN: 6.2500 mg | INTRAMUSCULAR | Status: DC | PRN

## 2012-03-20 MED ORDER — NALBUPHINE HCL 10 MG/ML IJ SOLN
5.0000 mg | INTRAMUSCULAR | Status: DC | PRN
  Filled 2012-03-20: qty 1

## 2012-03-20 MED ORDER — BUPIVACAINE IN DEXTROSE 0.75-8.25 % IT SOLN
INTRATHECAL | Status: DC | PRN
  Administered 2012-03-20: 1.5 mL via INTRATHECAL

## 2012-03-20 MED ORDER — MORPHINE SULFATE 0.5 MG/ML IJ SOLN
INTRAMUSCULAR | Status: AC
  Filled 2012-03-20: qty 10

## 2012-03-20 MED ORDER — LIDOCAINE-EPINEPHRINE 2 %-1:100000 IJ SOLN
INTRAMUSCULAR | Status: DC | PRN
  Administered 2012-03-20 (×2): 5 mL via INTRADERMAL
  Administered 2012-03-20: 3 mL via INTRADERMAL
  Administered 2012-03-20: 2 mL via INTRADERMAL

## 2012-03-20 MED ORDER — MIDAZOLAM HCL 5 MG/5ML IJ SOLN
INTRAMUSCULAR | Status: DC | PRN
  Administered 2012-03-20: 1 mg via INTRAVENOUS

## 2012-03-20 MED ORDER — ESTRADIOL 0.1 MG/GM VA CREA
TOPICAL_CREAM | VAGINAL | Status: AC
  Filled 2012-03-20: qty 42.5

## 2012-03-20 MED ORDER — SCOPOLAMINE 1 MG/3DAYS TD PT72
1.0000 | MEDICATED_PATCH | Freq: Once | TRANSDERMAL | Status: DC
  Administered 2012-03-20: 1.5 mg via TRANSDERMAL

## 2012-03-20 MED ORDER — FENTANYL CITRATE 0.05 MG/ML IJ SOLN
INTRAMUSCULAR | Status: AC
  Filled 2012-03-20: qty 5

## 2012-03-20 MED ORDER — SODIUM CHLORIDE 0.9 % IV SOLN
INTRAVENOUS | Status: DC
  Administered 2012-03-20: 11:00:00 via EPIDURAL
  Administered 2012-03-20: 10 mL/h via EPIDURAL
  Administered 2012-03-20: 17:00:00 via EPIDURAL
  Filled 2012-03-20 (×9): qty 20

## 2012-03-20 MED ORDER — SODIUM CHLORIDE 0.9 % IV SOLN
1.0000 ug/kg/h | INTRAVENOUS | Status: DC | PRN
  Filled 2012-03-20: qty 2.5

## 2012-03-20 MED ORDER — LACTATED RINGERS IV SOLN
INTRAVENOUS | Status: DC
  Administered 2012-03-20: 125 mL/h via INTRAVENOUS
  Administered 2012-03-20 (×3): via INTRAVENOUS
  Administered 2012-03-20 (×2): 125 mL/h via INTRAVENOUS

## 2012-03-20 MED ORDER — ACETAMINOPHEN 10 MG/ML IV SOLN
1000.0000 mg | Freq: Four times a day (QID) | INTRAVENOUS | Status: AC
  Administered 2012-03-20: 1000 mg via INTRAVENOUS
  Filled 2012-03-20 (×5): qty 100

## 2012-03-20 MED ORDER — PHENYLEPHRINE HCL 10 MG/ML IJ SOLN
INTRAMUSCULAR | Status: DC | PRN
  Administered 2012-03-20 (×10): .04 mg via INTRAVENOUS

## 2012-03-20 MED ORDER — NALOXONE HCL 0.4 MG/ML IJ SOLN: 0.4000 mg | INTRAMUSCULAR | Status: DC | PRN

## 2012-03-20 MED ORDER — CEFAZOLIN SODIUM-DEXTROSE 2-3 GM-% IV SOLR
INTRAVENOUS | Status: AC
  Filled 2012-03-20: qty 50

## 2012-03-20 MED ORDER — LIDOCAINE-EPINEPHRINE (PF) 2 %-1:200000 IJ SOLN
INTRAMUSCULAR | Status: AC
  Filled 2012-03-20: qty 20

## 2012-03-20 MED ORDER — MIDAZOLAM HCL 2 MG/2ML IJ SOLN
INTRAMUSCULAR | Status: AC
  Filled 2012-03-20: qty 2

## 2012-03-20 MED ORDER — PROPOFOL 10 MG/ML IV EMUL
INTRAVENOUS | Status: AC
  Filled 2012-03-20: qty 20

## 2012-03-20 MED ORDER — DIPHENHYDRAMINE HCL 50 MG/ML IJ SOLN: 25.0000 mg | INTRAMUSCULAR | Status: DC | PRN

## 2012-03-20 MED ORDER — CEFAZOLIN SODIUM-DEXTROSE 2-3 GM-% IV SOLR
2.0000 g | INTRAVENOUS | Status: AC
  Administered 2012-03-20: 2 g via INTRAVENOUS

## 2012-03-20 MED ORDER — PROPOFOL INFUSION 10 MG/ML OPTIME
INTRAVENOUS | Status: DC | PRN
  Administered 2012-03-20: 75 ug/min via INTRAVENOUS

## 2012-03-20 MED ORDER — GLYCOPYRROLATE 0.2 MG/ML IJ SOLN
INTRAMUSCULAR | Status: AC
  Filled 2012-03-20: qty 2

## 2012-03-20 MED ORDER — FENTANYL CITRATE 0.05 MG/ML IJ SOLN
INTRAMUSCULAR | Status: DC | PRN
  Administered 2012-03-20: 15 ug via INTRATHECAL

## 2012-03-20 MED ORDER — DIPHENHYDRAMINE HCL 25 MG PO CAPS
25.0000 mg | ORAL_CAPSULE | ORAL | Status: DC | PRN
  Filled 2012-03-20: qty 1

## 2012-03-20 MED ORDER — LIDOCAINE HCL (CARDIAC) 20 MG/ML IV SOLN
INTRAVENOUS | Status: DC | PRN
  Administered 2012-03-20: 20 mg via INTRAVENOUS

## 2012-03-20 MED ORDER — IBUPROFEN 600 MG PO TABS
600.0000 mg | ORAL_TABLET | Freq: Four times a day (QID) | ORAL | Status: DC | PRN
  Administered 2012-03-20 – 2012-03-21 (×3): 600 mg via ORAL
  Filled 2012-03-20 (×3): qty 1

## 2012-03-20 MED ORDER — SODIUM BICARBONATE 8.4 % IV SOLN
INTRAVENOUS | Status: AC
  Filled 2012-03-20: qty 50

## 2012-03-20 MED ORDER — NEOSTIGMINE METHYLSULFATE 1 MG/ML IJ SOLN
INTRAMUSCULAR | Status: AC
  Filled 2012-03-20: qty 10

## 2012-03-20 MED ORDER — FENTANYL CITRATE 0.05 MG/ML IJ SOLN
INTRAMUSCULAR | Status: AC
  Filled 2012-03-20: qty 2

## 2012-03-20 MED ORDER — KETOROLAC TROMETHAMINE 30 MG/ML IJ SOLN
INTRAMUSCULAR | Status: AC
  Filled 2012-03-20: qty 1

## 2012-03-20 MED ORDER — ONDANSETRON HCL 4 MG/2ML IJ SOLN
INTRAMUSCULAR | Status: DC | PRN
  Administered 2012-03-20: 4 mg via INTRAVENOUS

## 2012-03-20 MED ORDER — METOCLOPRAMIDE HCL 5 MG/ML IJ SOLN: 10.0000 mg | Freq: Three times a day (TID) | INTRAMUSCULAR | Status: DC | PRN

## 2012-03-20 MED ORDER — ESTRADIOL 0.1 MG/GM VA CREA
TOPICAL_CREAM | VAGINAL | Status: DC | PRN
  Administered 2012-03-20: 1 via VAGINAL

## 2012-03-20 MED ORDER — PROPOFOL 10 MG/ML IV EMUL
INTRAVENOUS | Status: DC | PRN
  Administered 2012-03-20 (×5): 20 mg via INTRAVENOUS

## 2012-03-20 MED ORDER — SCOPOLAMINE 1 MG/3DAYS TD PT72
MEDICATED_PATCH | TRANSDERMAL | Status: AC
  Filled 2012-03-20: qty 1

## 2012-03-20 MED ORDER — HYDROMORPHONE HCL PF 1 MG/ML IJ SOLN: 0.2500 mg | INTRAMUSCULAR | Status: DC | PRN

## 2012-03-20 MED ORDER — LIDOCAINE-EPINEPHRINE 1 %-1:100000 IJ SOLN
INTRAMUSCULAR | Status: DC | PRN
  Administered 2012-03-20: 16 mL

## 2012-03-20 MED ORDER — ONDANSETRON HCL 4 MG/2ML IJ SOLN: 4.0000 mg | Freq: Three times a day (TID) | INTRAMUSCULAR | Status: DC | PRN

## 2012-03-20 MED ORDER — DIPHENHYDRAMINE HCL 50 MG/ML IJ SOLN: 12.5000 mg | INTRAMUSCULAR | Status: DC | PRN

## 2012-03-20 SURGICAL SUPPLY — 42 items
CANISTER SUCTION 2500CC (MISCELLANEOUS) ×4 IMPLANT
CLOTH BEACON ORANGE TIMEOUT ST (SAFETY) ×4 IMPLANT
CONT PATH 16OZ SNAP LID 3702 (MISCELLANEOUS) IMPLANT
DECANTER SPIKE VIAL GLASS SM (MISCELLANEOUS) IMPLANT
DRAPE STERI URO 9X17 APER PCH (DRAPES) ×4 IMPLANT
DRESSING TELFA 8X3 (GAUZE/BANDAGES/DRESSINGS) ×4 IMPLANT
FILTER STRAW FLUID ASPIR (MISCELLANEOUS) IMPLANT
GAUZE PACKING 2X5 YD STERILE (GAUZE/BANDAGES/DRESSINGS) ×3 IMPLANT
GLOVE BIOGEL PI IND STRL 6.5 (GLOVE) ×3 IMPLANT
GLOVE BIOGEL PI IND STRL 8 (GLOVE) ×3 IMPLANT
GLOVE BIOGEL PI INDICATOR 6.5 (GLOVE) ×1
GLOVE BIOGEL PI INDICATOR 8 (GLOVE) ×1
GLOVE ECLIPSE 7.5 STRL STRAW (GLOVE) ×8 IMPLANT
GOWN PREVENTION PLUS LG XLONG (DISPOSABLE) ×16 IMPLANT
GOWN STRL REIN XL XLG (GOWN DISPOSABLE) ×4 IMPLANT
NDL SPNL 18GX3.5 QUINCKE PK (NEEDLE) ×1 IMPLANT
NDL SPNL 22GX3.5 QUINCKE BK (NEEDLE) IMPLANT
NEEDLE HYPO 22GX1.5 SAFETY (NEEDLE) IMPLANT
NEEDLE MAYO .5 CIRCLE (NEEDLE) IMPLANT
NEEDLE SPNL 18GX3.5 QUINCKE PK (NEEDLE) ×4 IMPLANT
NEEDLE SPNL 22GX3.5 QUINCKE BK (NEEDLE) IMPLANT
NS IRRIG 1000ML POUR BTL (IV SOLUTION) ×4 IMPLANT
PACK VAGINAL WOMENS (CUSTOM PROCEDURE TRAY) ×4 IMPLANT
PAD OB MATERNITY 4.3X12.25 (PERSONAL CARE ITEMS) ×4 IMPLANT
SEALER TISSUE X1 CVD JAW (INSTRUMENTS) IMPLANT
SUT VIC AB 0 CT1 18XCR BRD8 (SUTURE) ×9 IMPLANT
SUT VIC AB 0 CT1 27 (SUTURE) ×4
SUT VIC AB 0 CT1 27XBRD ANBCTR (SUTURE) ×3 IMPLANT
SUT VIC AB 0 CT1 8-18 (SUTURE) ×12
SUT VIC AB 1 CT1 27 (SUTURE)
SUT VIC AB 1 CT1 27XBRD ANTBC (SUTURE) IMPLANT
SUT VIC AB 2-0 SH 27 (SUTURE) ×16
SUT VIC AB 2-0 SH 27XBRD (SUTURE) ×12 IMPLANT
SUT VIC AB 2-0 UR5 27 (SUTURE) ×4 IMPLANT
SUT VIC AB 3-0 SH 27 (SUTURE)
SUT VIC AB 3-0 SH 27X BRD (SUTURE) IMPLANT
SUT VICRYL 0 TIES 12 18 (SUTURE) ×4 IMPLANT
SUT VICRYL 3 0 BR 18  UND (SUTURE)
SUT VICRYL 3 0 BR 18 UND (SUTURE) IMPLANT
TOWEL OR 17X24 6PK STRL BLUE (TOWEL DISPOSABLE) ×8 IMPLANT
TRAY FOLEY CATH 14FR (SET/KITS/TRAYS/PACK) ×4 IMPLANT
WATER STERILE IRR 1000ML POUR (IV SOLUTION) ×4 IMPLANT

## 2012-03-20 NOTE — Interval H&P Note (Signed)
History and Physical Interval Note:  03/20/2012 7:10 AM  Tamara Anthony  has presented today for surgery, with the diagnosis of uterine prolapse  The various methods of treatment have been discussed with the patient and family. After consideration of risks, benefits and other options for treatment, the patient has consented to   as a surgical intervention .  The patient's history has been reviewed, patient examined, no change in status, stable for surgery.  I have reviewed the patient's chart and labs.  Questions were answered to the patient's satisfaction.  Patient scheduled for transvaginal hysterotomy with ;possible BSO and anterior corporrhapy  [facility]

## 2012-03-20 NOTE — Anesthesia Postprocedure Evaluation (Signed)
Anesthesia Post Note  Patient: Tamara Anthony  Procedure(s) Performed: Procedure(s) (LRB): HYSTERECTOMY VAGINAL (N/A) ANTERIOR REPAIR (CYSTOCELE) (N/A)  Anesthesia type: Spinal  Patient location: PACU  Post pain: Pain level controlled  Post assessment: Post-op Vital signs reviewed  Last Vitals:  Filed Vitals:   03/20/12 1245  BP: 98/45  Pulse: 70  Temp: 36.9 C  Resp: 12    Post vital signs: Reviewed  Level of consciousness: awake  Complications: No apparent anesthesia complications

## 2012-03-20 NOTE — Anesthesia Procedure Notes (Signed)
Spinal  Patient location during procedure: OR Start time: 03/20/2012 7:34 AM Staffing Performed by: anesthesiologist  Preanesthetic Checklist Completed: patient identified, site marked, surgical consent, pre-op evaluation, timeout performed, IV checked, risks and benefits discussed and monitors and equipment checked Spinal Block Patient position: sitting Prep: site prepped and draped and DuraPrep Patient monitoring: cardiac monitor, continuous pulse ox, blood pressure and heart rate Approach: midline Location: L3-4 Injection technique: catheter Needle Needle type: Tuohy and Sprotte  Needle gauge: 24 G Needle length: 12.7 cm Needle insertion depth: 5.5 cm Catheter type: closed end flexible Catheter size: 19 g Catheter at skin depth: 10.5 cm Additional Notes CSE for Vag hyst and A/P repair.  LOR to air with tuohy at 5.5 cm.  25 ga Pencan via tuohy with transient paresthesia and immediate return of clear free flow CSF.  Dose given, Pencan removed.  Tuohy flushed with 3 ml saline and epidural catheter placed.  Tuohy removed, epidural secured at 10.5 cm at skin.  Catheter not tested.  Jasmine December, MD

## 2012-03-20 NOTE — Addendum Note (Signed)
Addendum  created 03/20/12 1706 by Elbert Ewings, CRNA   Modules edited:Notes Section

## 2012-03-20 NOTE — Addendum Note (Signed)
Addendum  created 03/20/12 1650 by Elbert Ewings, CRNA   Modules edited:Notes Section

## 2012-03-20 NOTE — Anesthesia Postprocedure Evaluation (Signed)
  Anesthesia Post-op Note  Patient: Tamara Anthony  Procedure(s) Performed: Procedure(s) (LRB) with comments: HYSTERECTOMY VAGINAL (N/A) -                        ANTERIOR REPAIR (CYSTOCELE) (N/A)  Patient Location: PACU and Women's Unit  Anesthesia Type:Spinal  Level of Consciousness: awake, alert  and oriented  Airway and Oxygen Therapy: Patient Spontanous Breathing  Post-op Pain: mild  Post-op Assessment: Patient's Cardiovascular Status Stable, Respiratory Function Stable, No signs of Nausea or vomiting, Adequate PO intake and Pain level controlled  Post-op Vital Signs: stable  Complications: No apparent anesthesia complications

## 2012-03-20 NOTE — H&P (View-Only) (Signed)
Tamara Anthony Patient is a 71 year old gravida 2 para 2 (2 normal spontaneous vaginal deliveries largest baby weighed 8-1/2 pounds) who is here for preoperative consultation who had been referred to our practice as a courtesy of Dr. Delorse Lek at [facility] at [place] as a result of patient's pelvic organ prolapse.Patient is very active in sports and is sexually active and recently she noticed a bulge in her vagina when she was showering . She denies any true stress urinary incontinence but states that if her bladder is completely full and if she is on her way to the bathroom she may leak a little bit. She does not have to use a pad on a daily basis Patient denies any vaginal atrophy. Patient denies any prior abdominal or pelvic surgery.   On last office visit during Valsalva maneuver in the supine and erect position patient did not leak urine even when the cystocele was reduced. There was no evidence of enterocele on rectovaginal exam .Q-tip angle tests (to determine angulation of the urethral vesical angle to determine if there is hypermobility of the urethra) was less than 30 (normal)  Her urodynamic evaluation was normal recently UPPP was less than 30. After bladder was completely full and bladder was displaced patient did not leak. Case was discussed with the urologist Dr. Patsi Sears and he did not feel that the patient needed a sling procedure at time of her planned t vaginal hysterectomy and anterior colporrhaphy.   During preop visit recently there was a question of fullness in her left adnexa and for this reason she was asked to return to the office today for an ultrasound. Results as follows: Uterus measures 6.7 x 5.3 x 3.1 cm endometrium 10.6 mm. A sonohysterogram was then undertaken and a cystic solid area with a theater vessel was seen going to the mass. Both right and left ovary were atrophic. There was no fluid in the cul-de-sac. Sonohysterogram defects seen in the  endometrial cavity cystic solid measuring 26 x 10 x 26 mm. An endometrial biopsy was obtained results pending at time of this dictation.  Pertinent Gynecological History:  Menses: post-menopausal  Bleeding: Post menopausal no bleeding  Contraception: none  DES exposure: denies  Blood transfusions: none  Sexually transmitted diseases: no past history  Previous GYN Procedures: Vaginal deliveries  Last mammogram: Additional views requested Date: 2013 in progress  Last pap: normal Date: ?  OB History: G2, P2  Menstrual History:  Menarche age: 7  No LMP recorded. Patient is postmenopausal.   Past Medical History   Diagnosis  Date   .  Diabetes mellitus    .  Hypertension    .  Hyperlipidemia     History reviewed. No pertinent past surgical history.  Family History   Problem  Relation  Age of Onset   .  Diabetes  Mother    .  Heart attack  Mother    .  Hyperlipidemia  Mother    .  Hypertension  Mother    .  Hypertension  Father     Social History: reports that she has never smoked. She has never used smokeless tobacco. She reports that she drinks about 1.2 ounces of alcohol per week. She reports that she does not use illicit drugs.  Allergies: No Known Allergies   (Not in a hospital admission)  REVIEW OF SYSTEMS: A ROS was performed and pertinent positives and negatives are included in the history.  GENERAL: No fevers or chills. HEENT: No  change in vision, no earache, sore throat or sinus congestion. NECK: No pain or stiffness. CARDIOVASCULAR: No chest pain or pressure. No palpitations. PULMONARY: No shortness of breath, cough or wheeze. GASTROINTESTINAL: No abdominal pain, nausea, vomiting or diarrhea, melena or bright red blood per rectum. GENITOURINARY: No urinary frequency, urgency, hesitancy or dysuria. MUSCULOSKELETAL: No joint or muscle pain, no back pain, no recent trauma. DERMATOLOGIC: No rash, no itching, no lesions. ENDOCRINE: No polyuria, polydipsia, no heat or cold  intolerance. No recent change in weight. HEMATOLOGICAL: No anemia or easy bruising or bleeding. NEUROLOGIC: No headache, seizures, numbness, tingling or weakness. PSYCHIATRIC: No depression, no loss of interest in normal activity or change in sleep pattern.  Blood pressure 138/80.  Physical Exam:  HEENT:unremarkable  Neck:Supple, midline, no thyroid megaly, no carotid bruits  Lungs: Clear to auscultation no rhonchi's or wheezes  Heart:Regular rate and rhythm, no murmurs or gallops  Breast Exam: Not examined  Abdomen: Soft nontender no rebound or guarding within normal limits  Pelvic:BUS  Vagina: No lesions or discharge well estrogenized  Cervix: No lesions or discharge  Uterus: Axial nontender  Adnexa: Questionable left adnexal fullness  Extremities: No cords, no edema  Rectal: Not examined  No results found for this or any previous visit (from the past 24 hour(s)).  No results found.  Assessment/Plan:  Postmenopausal patient with symptomatic first degree uterine prolapse and second-degree cystocele with no stress urinary incontinence. Normal urodynamic evaluation. Patient will be scheduled to undergo transvaginal hysterectomy with possible bilateral salpingo-oophorectomy and anterior colporrhaphy. We'll need medical clearance from her primary physician since patient has history of hypertension and type 2 diabetes.  Patient had been placed on Estrace vaginal cream to improve vaginal epithelialization to facilitate the dissection during her surgery. She will continue to apply the estrogen vaginal cream twice a week until her surgery. Patient will followup with a recommended mammogram 6 months from her study done in May in reference to small oily cyst noted on her left breast post trauma. The results of endometrial biopsy are pending at time of this dictation. This appears to be a cystic degeneration a year a polyp or a submucous myoma. Minimal amount of tissue was obtained the was submitted for  histological evaluation. The following risk for her surgery were discussed:   Patient was counseled as to the risk of surgery to include the following:  1. Infection (prohylactic antibiotics will be administered)  2. DVT/Pulmonary Embolism (prophylactic pneumo compression stockings will be used)  3.Trauma to internal organs requiring additional surgical procedure to repair any injury to  Internal organs requiring perhaps additional hospitalization days. If there is any injury to the bladder she may have to go home with a Foley catheter leg bag to allow healing. 4.Hemmorhage requiring transfusion and blood products which carry risks such as anaphylactic reaction, hepatitis and AIDS  5. We discussed in the event that this may be a form of uterine cancer at that we will have to wait for the final pathology report and then referred to oncologist if indicated. 6. We discussed that if her ovaries are too small or not accessible that we may either leave them or go in and remove them laparoscopically but more than likely we will leave them alone. Patient had received literature information on the procedure scheduled and all her questions were answeredand accepts all risk.

## 2012-03-20 NOTE — Transfer of Care (Signed)
Immediate Anesthesia Transfer of Care Note  Patient: Tamara Anthony  Procedure(s) Performed: Procedure(s) (LRB) with comments: HYSTERECTOMY VAGINAL (N/A) -                        ANTERIOR REPAIR (CYSTOCELE) (N/A)  Patient Location: PACU  Anesthesia Type:CSE  Level of Consciousness: awake, alert , oriented and patient cooperative  Airway & Oxygen Therapy: Patient Spontanous Breathing and Patient connected to nasal cannula oxygen  Post-op Assessment: Report given to PACU RN and Post -op Vital signs reviewed and stable  Post vital signs: Reviewed and stable  Complications: No apparent anesthesia complications

## 2012-03-20 NOTE — Op Note (Signed)
03/20/2012  9:15 AM  PATIENT:  Tamara Anthony  71 y.o. female  PRE-OPERATIVE DIAGNOSIS: First degree uterine prolapse second-degree cystocele  POST-OPERATIVE DIAGNOSIS:  First degree uterine prolapse second-degree cystocele  PROCEDURE:  Procedure(s): HYSTERECTOMY VAGINAL ANTERIOR REPAIR (CYSTOCELE)  SURGEON:  Surgeon(s): Ok Edwards, MD Dara Lords, MD  ANESTHESIA:  Epidural  FINDINGS: First degree uterine prolapse with second-degree cystocele, atretic ovaries minimally seen  DESCRIPTION OF OPERATION:Vag Hyst & Ant Repair After administration of general anesthesia, the patient was placed in the dorsolithotomy position and examination under general anesthesia performed with findings as noted above. Confirmation of the correct patient, procedure and site were established. Time out was accomplished identifying the patient and the procedure to be performed. The patient's vagina and perineum were prepped and draped in the usual sterile fashion.  The bladder was drained with a straight catheter, and a weighted speculum was placed into the vagina to visualize the cervix. The cervix was grasped with a double-tooth tenaculum. The cervix was circumferentially infiltrated in the submucosal layer with 16 cc of 1% Lidocaine with 1:100,000 Epinepghrine.  A circumferential incision was made at the cervicovaginal junction. The incision was carried down sharply anteriorly to the endopelvic fascia. The bladder was dissected away from the anterior surface of the uterus using blunt dissection. The vesicouterine peritoneal reflection was identified and sharply excised. The anterior cul de sac was explored. The posterior aspect of the mucosal incision was then dissected sharply. The peritoneum was identified and incised, and the posterior cul de sac explored.A long weighted billed speculum was placed in culdesac. The uterosacral ligaments were clamped, cut and ligated with #0-Vicryl suture bilaterally.  #0-Vicryl was used throughout the case unless otherwise stated. The uterosacral ligaments were affixed to the corners of the vagina. The remainder of the cardinal and broad ligaments were then serially clamped, cut and ligated bilaterally. The uterine fundus was delivered with a single tooth tenaculum prior to clamping the upper portion of the broad ligaments.Both pedicles were then clamped and cut followed by a free tie then a transfixation stitch of 0-Vicryl. The uterus was removed intact and submitted to pathology for examination. The ovaries were  Atretic and minimally seen and high in the pelvis. It was decided at this point a lengthy very small if any ovarian tissue behind to minimize any potential injury or morbidity. The pedicles were examined and were hemostatic.   ANTERIOR REPAIR  A midline incision was made through the vaginal mucosa beginning at the apex and extending to within 1cm of the external urethral meatus. The vaginal mucosa was sharply dissected laterally. the paraurethral and paravescial fascia was mobilized by blunt dissection using a single layer of gauze sponge. A vertical mattress stitch of @2 .0 Vicryl was placed in the paraurethral fascia to the pubourethral ligament at the urethral-vesical junction. The remainder of the paraurethral fascia was reapproximated using similar mattress sutures. Approximately 0.5cm of redundant vaginal mucosa was excised from each midline edge. The anterior vaginal mucosa was closed transversely with interrupted figure-of-X sutures of #2.0 Vicryl.the vaginal cuff was then closed with interrupted sutures of 0 Vicryl. A vaginal packing with Estrace cream was placed in the vagina. Patient was transferred to recovery room stable vital signs.   ESTIMATED BLOOD LOSS: 100 cc  Intake/Output Summary (Last 24 hours) at 03/20/12 0915 Last data filed at 03/20/12 0900  Gross per 24 hour  Intake   1600 ml  Output    400 ml  Net   1200 ml  BLOOD  ADMINISTERED:none   LOCAL MEDICATIONS USED:  XYLOCAINE 16 cc of 1% lidocaine with 1 100,000 epinephrine cervical vaginal fold  SPECIMEN:  Source of Specimen:  Uterus and cervix  DISPOSITION OF SPECIMEN:  PATHOLOGY  COUNTS:  YES  PLAN OF CARE: Transfer to PACU  [facility] HMD9:15 AMTD@

## 2012-03-20 NOTE — Preoperative (Signed)
Beta Blockers   Reason not to administer Beta Blockers:Hold beta blocker due to other;patient took medication at home as prescribed this a.m.

## 2012-03-21 ENCOUNTER — Encounter (HOSPITAL_COMMUNITY): Payer: Self-pay | Admitting: Gynecology

## 2012-03-21 LAB — CBC
HCT: 29 % — ABNORMAL LOW (ref 36.0–46.0)
Hemoglobin: 9.6 g/dL — ABNORMAL LOW (ref 12.0–15.0)
MCH: 28.7 pg (ref 26.0–34.0)
MCHC: 33.1 g/dL (ref 30.0–36.0)
MCV: 86.8 fL (ref 78.0–100.0)
RBC: 3.34 MIL/uL — ABNORMAL LOW (ref 3.87–5.11)

## 2012-03-21 LAB — BASIC METABOLIC PANEL
BUN: 11 mg/dL (ref 6–23)
CO2: 28 mEq/L (ref 19–32)
Calcium: 8.5 mg/dL (ref 8.4–10.5)
GFR calc non Af Amer: 84 mL/min — ABNORMAL LOW (ref >90)
Glucose, Bld: 102 mg/dL — ABNORMAL HIGH (ref 70–99)
Sodium: 143 mEq/L (ref 135–145)

## 2012-03-21 MED ORDER — METOCLOPRAMIDE HCL 10 MG PO TABS: 10.0000 mg | ORAL_TABLET | Freq: Three times a day (TID) | ORAL | Status: DC

## 2012-03-21 MED ORDER — OXYCODONE-ACETAMINOPHEN 5-325 MG PO TABS: 1.0000 | ORAL_TABLET | ORAL | Status: DC | PRN

## 2012-03-21 NOTE — Discharge Summary (Signed)
Physician Discharge Summary  Patient ID: Tamara Anthony MRN: [id number] DOB/AGE: [id number] 70 y.o.  Admit date: 03/20/2012 Discharge date: 03/21/2012  Admission Diagnoses:  Discharge Diagnoses:  Active Problems:  * No active hospital problems. *    Discharged Condition: good  Hospital Course: On the morning of November 7 patient underwent a transvaginal hysterectomy with anterior colporrhaphy secondary to uterine prolapse and second-degree cystocele. Vaginal packing was removed 10 hours after her surgery. Patient did well intraoperatively she had an epidural/spinal for anesthesia which was discontinued early this morning. Patient had adequate urinary output throughout the night. Urine was clear. Vital signs are stable. Patient's diet was advanced and this morning she was on a full regular diet and had passed gas. Her abdomen was soft nontender no rebound or guarding positive bowel sounds. Vaginal pack was dry. We held off her metformin and her blood sugar was 102 this morning. She was up ambulating and was rated be discharged home. Pathology report pending at time of this dictation. Foley catheter was removed approximately 2 and half hours ago so she is not voiding spontaneously had a will check before she is discharged home.  Consults: None  Significant Diagnostic Studies: labs: CBC: Hemoglobin 9.6 hematocrit 26.0. Electrolytes within normal limits blood sugar 102  Treatments: surgery: Transvaginal hysterectomy with anterior colporrhaphy  Discharge Exam: Blood pressure 101/67, pulse 95, temperature 98.7 F (37.1 C), temperature source Oral, resp. rate 18, height 5\' 6"  (1.676 m), weight 178 lb (80.74 kg), SpO2 98.00%. General appearance: alert, cooperative and no distress Back: symmetric, no curvature. ROM normal. No CVA tenderness. Resp: clear to auscultation bilaterally and normal percussion bilaterally Cardio: regular rate and rhythm, S1, S2 normal, no murmur, click, rub or gallop GI:  soft, non-tender; bowel sounds normal; no masses,  no organomegaly Extremities: extremities normal, atraumatic, no cyanosis or edema Vaginal pack dry  Disposition: Patient will be discharged home today and to return to the office in 2 weeks for postop appointment. Instructions and prescriptions provided see below.  Discharge Orders    Future Appointments: Provider: Department: Dept Phone: Center:   04/07/2012 10:40 AM Ok Edwards, MD [facility] [phone] [facility]     Future Orders Please Complete By Expires   Resume previous diet      Driving Restrictions      Comments:   No driving for 1 weeks   Lifting restrictions      Comments:   No lifting for 6 weeks   Call MD for:  temperature >100.5      Call MD for:  redness, tenderness, or signs of infection (pain, swelling, bleeding, redness, odor or green/yellow discharge around incision site)      Call MD for:  severe or increased pain, loss or decreased feeling  in affected limb(s)      Discharge instructions      Comments:   Postop appointment with Dr. Lily Peer Nov 25th 10:40am  Hysterectomy, Abdominal & Vaginal Care After Refer to this sheet in the next few weeks. These discharge instructions provide you with general information on caring for yourself after you leave the hospital. Your caregiver may also give you specific instructions. Your treatment has been planned according to the most current medical practices available, but unavoidable complications sometimes occur. If you have any problems or questions after discharge, please call your caregiver. HOME CARE INSTRUCTIONS Healing will take time. You will have tenderness at the surgery site. There may be some swelling and bruising around this area if you  had an abdominal hysterectomy. Have an adult stay with you the first 48 to 72 hours after surgery, and then for 1 to 2 weeks afterward to help with daily activities. Only take over-the-counter or prescription  medicines for pain, discomfort, or fever as directed by your caregiver.  Do not take aspirin. It can cause bleeding.  Do not drive when taking pain medicine.  It will be normal to be sore for a couple weeks after surgery. See your caregiver if this seems to be getting worse rather than better.  Follow your caregiver's advice regarding diet, exercise, lifting, driving, and general activities.  Take showers instead of baths for a few weeks as directed.  You may resume your usual diet as directed.  Get plenty of rest and sleep.  Do not douche, use tampons, or have sexual intercourse until your caregiver says it is okay.  Change your bandages (dressings) as directed if you had an abdominal hysterectomy.  Take your temperature twice a day and write it down.  Do not drink alcohol until your caregiver says it is okay.  If you develop constipation, you may take a mild laxative with your caregiver's permission. Eating bran foods helps with constipation problems. Drink enough water and fluids to keep your urine clear or pale yellow.  Do not sign any legal documents until you feel normal again.  Keep all of your follow-up appointments.  Make sure you and your family understand everything about your operation and recovery.  SEEK MEDICAL CARE IF: There is swelling, redness, or increasing pain in the wound area.  Fluid (pus) is coming from the wound.  You notice a bad smell coming from the wound or surgical dressing.  You have pain, redness, and swelling from the intravenous (IV) site.  The wound breaks open.  You feel dizzy.  You develop pain or bleeding when you urinate.  You develop diarrhea.  You feel sick to your stomach (nauseous) and throw up (vomit).  You develop abnormal vaginal discharge.  You develop a rash.  You have any type of abnormal reaction or develop an allergy to your medicine.  Your pain medicine is not relieving your pain.  seek immediate medical care if: You have an oral  temperature above 100, not controlled by medicine. HYYou develop chest pain.  You develop shortness of breath.  You pass out (faint).  You develop pain, swelling, or redness of your leg.  You develop heavy vaginal bleeding, with or without blood clots.  MAKE SURE YOU: Understand these instructions.  Will watch your condition.  Will get help right away if you are not doing well or get worse.  Document Released: 07/21/2003 Document Re-Released: 10/18/2009 [facility] Patient Information 2011 [place], [place].   [facility] HMD8:12 AMTD@       Medication List     As of 03/21/2012  8:16 AM    STOP taking these medications         estradiol 0.1 MG/GM vaginal cream   Commonly known as: ESTRACE      TAKE these medications         aspirin 81 MG tablet   Take 81 mg by mouth daily.      b complex vitamins capsule   Take 1 capsule by mouth daily.      carvedilol 40 MG 24 hr capsule   Commonly known as: COREG CR   Take 40 mg by mouth daily.      hydrochlorothiazide 25 MG tablet   Commonly known as:  HYDRODIURIL   Take 12.5 mg by mouth daily.      metFORMIN 500 MG 24 hr tablet   Commonly known as: GLUCOPHAGE-XR   Take 500 mg by mouth daily after supper.      metoCLOPramide 10 MG tablet   Commonly known as: REGLAN   Take 1 tablet (10 mg total) by mouth 3 (three) times daily with meals.      multivitamin with minerals tablet   Take 1 tablet by mouth daily.      OMEGA-3 CF PO   Take 1 capsule by mouth daily.      oxyCODONE-acetaminophen 5-325 MG per tablet   Commonly known as: PERCOCET/ROXICET   Take 1-2 tablets by mouth every 4 (four) hours as needed.      quinapril 40 MG tablet   Commonly known as: ACCUPRIL   Take 40 mg by mouth at bedtime.      rosuvastatin 10 MG tablet   Commonly known as: CRESTOR   Take 10 mg by mouth daily.         SignedOk Edwards 03/21/2012, 8:16 AM

## 2012-03-21 NOTE — Progress Notes (Signed)
Discharge instructions given to patient and significant other at bedside.  Medication, activity instructions, when to call the doctor, follow up appointments and community resources discussed.  No questions at this time.  Patient left unit in wheelchair in stable condition, with personal belongings and prescriptions, accompanied by staff.  Osvaldo Angst, RN----------

## 2012-04-07 ENCOUNTER — Ambulatory Visit (INDEPENDENT_AMBULATORY_CARE_PROVIDER_SITE_OTHER): Payer: Medicare Other | Admitting: Gynecology

## 2012-04-07 ENCOUNTER — Encounter: Payer: Self-pay | Admitting: Gynecology

## 2012-04-07 VITALS — BP 130/80

## 2012-04-07 DIAGNOSIS — Z9889 Other specified postprocedural states: Secondary | ICD-10-CM

## 2012-04-07 MED ORDER — CLINDAMYCIN PHOSPHATE 2 % VA CREA: 1.0000 | TOPICAL_CREAM | Freq: Every day | VAGINAL | Status: DC

## 2012-04-07 NOTE — Progress Notes (Signed)
Patient presented to the office today for 2 weeks postop visit. Patient status post transvaginal hysterectomy with anterior colporrhaphy as a result of first-degree uterine prolapse and second-degree cystocele. Her ovaries were atrophic and were not seen so bilateral salpingo-oophorectomy was not done. Patient is doing well. Pathology report discussed with the patient as follows:  Diagnosis Uterus and cervix - CERVIX: SLIGHT CERVICITIS AND SQUAMOUS METAPLASIA. - ENDOMETRIUM: INACTIVE WITH BENIGN ENDOMETRIAL POLYPS. NO HYPERPLASIA OR CARCINOMA. - MYOMETRIUM AND UTERINE SEROSA: UNREMARKABLE.  Exam: Abdomen soft nontender no rebound or guarding Vagina: Anterior colporrhaphy and vaginal cuff healing nicely. Silver nitrate was utilized to cauterize some small area that Appear to be in bleeding minimally. Bimanual examination was unremarkable. Rectal exam not done.  Assessment/plan: Patient 2 weeks postop status post transvaginal hysterectomy with anterior colporrhaphy doing well. Prescription for Cleocin vaginal cream was prescribed to apply each bedtime for 5 nights. Patient will return back to the office in 4 weeks for final postop visit. Patient stated she started riding her bicycle and it to her to wait until her full 6 weeks postop are completed.

## 2012-05-15 ENCOUNTER — Encounter: Payer: Self-pay | Admitting: Gynecology

## 2012-05-15 ENCOUNTER — Ambulatory Visit (INDEPENDENT_AMBULATORY_CARE_PROVIDER_SITE_OTHER): Payer: Medicare Other | Admitting: Gynecology

## 2012-05-15 VITALS — BP 132/74

## 2012-05-15 DIAGNOSIS — Z9889 Other specified postprocedural states: Secondary | ICD-10-CM

## 2012-05-15 MED ORDER — NONFORMULARY OR COMPOUNDED ITEM: Status: DC

## 2012-05-15 NOTE — Patient Instructions (Addendum)

## 2012-05-15 NOTE — Progress Notes (Signed)
Patient presented to the office today for her six-week postop visit. Patient status post transvaginal hysterectomy with anterior colporrhaphy as a result of first-degree uterine prolapse and second-degree cystocele. Her ovaries were atrophic and were not seen so bilateral salpingo-oophorectomy was not done. Patient is doing well otherwise she feels that her bladder showed full she might leak a little bit a urine but otherwise doing well. Pathology report as follows:  Diagnosis  Uterus and cervix  - CERVIX: SLIGHT CERVICITIS AND SQUAMOUS METAPLASIA.  - ENDOMETRIUM: INACTIVE WITH BENIGN ENDOMETRIAL POLYPS. NO HYPERPLASIA OR  CARCINOMA.  - MYOMETRIUM AND UTERINE SEROSA: UNREMARKABLE.  Exam: Abdomen: Soft nontender no rebound or guarding Pelvic: Bartholin urethra Skene within normal limits Vagina: Vaginal cuff intact Bimanual exam no palpable masses or tenderness Rectal exam not done  Assessment/plan: Patient 6 weeks status post transvaginal hysterectomy with anterior colporrhaphy as a result of first-degree uterine prolapse and second-degree cystocele doing well. She may resume full normal activity next week. We had discussed the placing her on vaginal estrogen such as estradiol 0.02% 1 mL pre-field applicator to apply twice a week. We have given her literature information on Kaegel exercises. We'll continue to monitor otherwise we'll see her back in one year or when necessary. The risks benefits and pros and cons of topical estrogen were discussed. Patient's mammogram is up-to-date. Patient with no history of breast cancer.

## 2012-06-28 ENCOUNTER — Other Ambulatory Visit: Payer: Self-pay

## 2012-07-31 ENCOUNTER — Other Ambulatory Visit: Payer: Self-pay | Admitting: Dermatology

## 2012-12-17 ENCOUNTER — Other Ambulatory Visit: Payer: Self-pay

## 2013-01-26 ENCOUNTER — Encounter: Payer: Self-pay | Admitting: Gynecology

## 2013-01-26 ENCOUNTER — Ambulatory Visit (INDEPENDENT_AMBULATORY_CARE_PROVIDER_SITE_OTHER): Payer: Medicare Other | Admitting: Gynecology

## 2013-01-26 VITALS — BP 138/80 | Ht 64.5 in | Wt 174.0 lb

## 2013-01-26 DIAGNOSIS — N9489 Other specified conditions associated with female genital organs and menstrual cycle: Secondary | ICD-10-CM

## 2013-01-26 DIAGNOSIS — R635 Abnormal weight gain: Secondary | ICD-10-CM

## 2013-01-26 DIAGNOSIS — N949 Unspecified condition associated with female genital organs and menstrual cycle: Secondary | ICD-10-CM

## 2013-01-26 DIAGNOSIS — N952 Postmenopausal atrophic vaginitis: Secondary | ICD-10-CM

## 2013-01-26 DIAGNOSIS — Z79890 Hormone replacement therapy (postmenopausal): Secondary | ICD-10-CM

## 2013-01-26 NOTE — Progress Notes (Signed)
Tamara Anthony 07/06/1940 [id number]   History:    72 y.o.  for a GYN followup. The patient was seen in the office for her final six-week postop visit in January 2014. Patient was status post transvaginal hysterectomy with anterior colporrhaphy as a result of first-degree uterine prolapse and second-degree cystocele. Her ovaries were atrophic and were not seen so bilateral salpingo-oophorectomy was not done. She is asymptomatic and doing well otherwise. Her pathology reported benign. She is currently on estradiol 0.02% transvaginally twice a week for vaginal atrophy and has done well. Her PCP is Dr. Doristine Counter who has been monitoring her type 2 diabetes and hypertension. The patient exercises regularly and is taking her calcium and vitamin D. She had a normal bone density study in 2013. Patient refuses her shingles vaccine. Tdap vaccine up-to-date. Last colonoscopy 5 years ago. Patient with past history of benign colon polyps. Patient's last mammogram November 2013 was normal.   Past medical history,surgical history, family history and social history were all reviewed and documented in the EPIC chart.  Gynecologic History No LMP recorded. Patient is postmenopausal. Contraception: status post hysterectomy Last Pap: greater than 5 years ago normal.. Results were: normal Last mammogram: 2013. Results were: normal  Obstetric History OB History  Gravida Para Term Preterm AB SAB TAB Ectopic Multiple Living  2 2 2       2     # Outcome Date GA Lbr Len/2nd Weight Sex Delivery Anes PTL Lv  2 TRM     F SVD  N Y  1 TRM     F SVD  N Y       ROS: A ROS was performed and pertinent positives and negatives are included in the history.  GENERAL: No fevers or chills. HEENT: No change in vision, no earache, sore throat or sinus congestion. NECK: No pain or stiffness. CARDIOVASCULAR: No chest pain or pressure. No palpitations. PULMONARY: No shortness of breath, cough or wheeze. GASTROINTESTINAL: No abdominal pain,  nausea, vomiting or diarrhea, melena or bright red blood per rectum. GENITOURINARY: No urinary frequency, urgency, hesitancy or dysuria. MUSCULOSKELETAL: No joint or muscle pain, no back pain, no recent trauma. DERMATOLOGIC: No rash, no itching, no lesions. ENDOCRINE: No polyuria, polydipsia, no heat or cold intolerance. No recent change in weight. HEMATOLOGICAL: No anemia or easy bruising or bleeding. NEUROLOGIC: No headache, seizures, numbness, tingling or weakness. PSYCHIATRIC: No depression, no loss of interest in normal activity or change in sleep pattern.     Exam: chaperone present  BP 138/80  Ht 5' 4.5" (1.638 m)  Wt 174 lb (78.926 kg)  BMI 29.42 kg/m2  Body mass index is 29.42 kg/(m^2).  General appearance : Well developed well nourished female. No acute distress HEENT: Neck supple, trachea midline, no carotid bruits, no thyroidmegaly Lungs: Clear to auscultation, no rhonchi or wheezes, or rib retractions  Heart: Regular rate and rhythm, no murmurs or gallops Breast:Examined in sitting and supine position were symmetrical in appearance, no palpable masses or tenderness,  no skin retraction, no nipple inversion, no nipple discharge, no skin discoloration, no axillary or supraclavicular lymphadenopathy Abdomen: no palpable masses or tenderness, no rebound or guarding Extremities: no edema or skin discoloration or tenderness  Pelvic:  Bartholin, Urethra, Skene Glands: Within normal limits             Vagina: No gross lesions or discharge  Cervix: absence  Uterus absent  Adnexa  Without masses or tenderness  Anus and perineum  normal   Rectovaginal  normal sphincter tone without palpated masses or tenderness             Hemoccult Card provided     Assessment/Plan:  72 y.o. female postmenopausal status post transvaginal hysterectomy with anterior colporrhaphy January of this year doing well. Patient on vaginal estrogen twice a week. The patient will follow up with her PCP for her  annual exam and labs. She will also schedule for followup colonoscopy. She was reminded to do her monthly breast exam. We discussed importance of calcium and vitamin D for osteoporosis prevention. Pap smear not done the new guidelines were discussed.    Ok Edwards MD, 10:26 AM 01/26/2013

## 2013-01-26 NOTE — Patient Instructions (Addendum)
Patient information: High cholesterol (The Basics)  What is cholesterol? - Cholesterol is a substance that is found in the blood. Everyone has some. It is needed for good health. The problem is, people sometimes have too much cholesterol. Compared with people with normal cholesterol, people with high cholesterol have a higher risk of heart attacks, strokes, and other health problems. The higher your cholesterol, the higher your risk of these problems. Cholesterol levels in your body are determined significantly by your diet. Cholesterol levels may also be related to heart disease. The following material helps to explain this relationship and discusses what you can do to help keep your heart healthy. Not all cholesterol is bad. Low-density lipoprotein (LDL) cholesterol is the "bad" cholesterol. It may cause fatty deposits to build up inside your arteries. High-density lipoprotein (HDL) cholesterol is "good." It helps to remove the "bad" LDL cholesterol from your blood. Cholesterol is a very important risk factor for heart disease. Other risk factors are high blood pressure, smoking, stress, heredity, and weight.  The heart muscle gets its supply of blood through the coronary arteries. If your LDL cholesterol is high and your HDL cholesterol is low, you are at risk for having fatty deposits build up in your coronary arteries. This leaves less room through which blood can flow. Without sufficient blood and oxygen, the heart muscle cannot function properly and you may feel chest pains (angina pectoris). When a coronary artery closes up entirely, a part of the heart muscle may die, causing a heart attack (myocardial infarction).  CHECKING CHOLESTEROL When your caregiver sends your blood to a lab to be analyzed for cholesterol, a complete lipid (fat) profile may be done. With this test, the total amount of cholesterol and levels of LDL and HDL are determined. Triglycerides  are a type of fat that circulates in the blood and can also be used to determine heart disease risk. Are there different types of cholesterol? - Yes, there are a few different types. If you get a cholesterol test, you may hear your doctor or nurse talk about: Total cholesterol  LDL cholesterol - Some people call this the "bad" cholesterol. That's because having high LDL levels raises your risk of heart attacks, strokes, and other health problems.  HDL cholesterol - Some people call this the "good" cholesterol. That's because having high HDL levels lowers your risk of heart attacks, strokes, and other health problems.  Non-HDL cholesterol - Non-HDL cholesterol is your total cholesterol minus your HDL cholesterol.  Triglycerides - Triglycerides are not cholesterol. They are a type of fat. But they often get measured when cholesterol is measured. (Having high triglycerides also seems to increase the risk of heart attacks and strokes.)   Keep in mind, though, that many people who cannot meet these goals still have a low risk of heart attacks and strokes. What should I do if my doctor tells me I have high cholesterol? - Ask your doctor what your overall risk of heart attacks and strokes is. High cholesterol, by itself, is not always a reason to worry. Having high cholesterol is just one of many things that can increase your risk of heart attacks and strokes. Other factors that increase your risk include:  Cigarette smoking  High blood pressure  Having a parent, sister, or brother who got heart disease at a young age (Young, in this case, means younger than 55 for men and younger than 65 for women.)  Being a man (Women are at risk, too, but men   have a higher risk.)  Older age  If you are at high risk of heart attacks and strokes, having high cholesterol is a problem. On the other hand, if you have are at low risk, having high cholesterol may not mean much. Should I take medicine to lower cholesterol? - Not  everyone who has high cholesterol needs medicines. Your doctor or nurse will decide if you need them based on your age, family history, and other health concerns.  You should probably take a cholesterol-lowering medicine called a statin if you: Already had a heart attack or stroke  Have known heart disease  Have diabetes  Have a condition called peripheral artery disease, which makes it painful to walk, and happens when the arteries in your legs get clogged with fatty deposits  Have an abdominal aortic aneurysm, which is a widening of the main artery in the belly  Most people with any of the conditions listed above should take a statin no matter what their cholesterol level is. If your doctor or nurse puts you on a statin, stay on it. The medicine may not make you feel any different. But it can help prevent heart attacks, strokes, and death.  Can I lower my cholesterol without medicines? - Yes, you can lower your cholesterol some by:  Avoiding red meat, butter, fried foods, cheese, and other foods that have a lot of saturated fat  Losing weight (if you are overweight)  Being more active Even if these steps do little to change your cholesterol, they can improve your health in many ways.                                                   Cholesterol Control Diet  CONTROLLING CHOLESTEROL WITH DIET Although exercise and lifestyle factors are important, your diet is key. That is because certain foods are known to raise cholesterol and others to lower it. The goal is to balance foods for their effect on cholesterol and more importantly, to replace saturated and trans fat with other types of fat, such as monounsaturated fat, polyunsaturated fat, and omega-3 fatty acids. On average, a person should consume no more than 15 to 17 g of saturated fat daily. Saturated and trans fats are considered "bad" fats, and they will raise LDL cholesterol. Saturated fats are primarily found in animal products such as  meats, butter, and cream. However, that does not mean you need to sacrifice all your favorite foods. Today, there are good tasting, low-fat, low-cholesterol substitutes for most of the things you like to eat. Choose low-fat or nonfat alternatives. Choose round or loin cuts of red meat, since these types of cuts are lowest in fat and cholesterol. Chicken (without the skin), fish, veal, and ground turkey breast are excellent choices. Eliminate fatty meats, such as hot dogs and salami. Even shellfish have little or no saturated fat. Have a 3 oz (85 g) portion when you eat lean meat, poultry, or fish. Trans fats are also called "partially hydrogenated oils." They are oils that have been scientifically manipulated so that they are solid at room temperature resulting in a longer shelf life and improved taste and texture of foods in which they are added. Trans fats are found in stick margarine, some tub margarines, cookies, crackers, and baked goods.  When baking and cooking, oils are an excellent substitute   for butter. The monounsaturated oils are especially beneficial since it is believed they lower LDL and raise HDL. The oils you should avoid entirely are saturated tropical oils, such as coconut and palm.  Remember to eat liberally from food groups that are naturally free of saturated and trans fat, including fish, fruit, vegetables, beans, grains (barley, rice, couscous, bulgur wheat), and pasta (without cream sauces).  IDENTIFYING FOODS THAT LOWER CHOLESTEROL  Soluble fiber may lower your cholesterol. This type of fiber is found in fruits such as apples, vegetables such as broccoli, potatoes, and carrots, legumes such as beans, peas, and lentils, and grains such as barley. Foods fortified with plant sterols (phytosterol) may also lower cholesterol. You should eat at least 2 g per day of these foods for a cholesterol lowering effect.  Read package labels to identify low-saturated fats, trans fats free, and  low-fat foods at the supermarket. Select cheeses that have only 2 to 3 g saturated fat per ounce. Use a heart-healthy tub margarine that is free of trans fats or partially hydrogenated oil. When buying baked goods (cookies, crackers), avoid partially hydrogenated oils. Breads and muffins should be made from whole grains (whole-wheat or whole oat flour, instead of "flour" or "enriched flour"). Buy non-creamy canned soups with reduced salt and no added fats.  FOOD PREPARATION TECHNIQUES  Never deep-fry. If you must fry, either stir-fry, which uses very little fat, or use non-stick cooking sprays. When possible, broil, bake, or roast meats, and steam vegetables. Instead of dressing vegetables with butter or margarine, use lemon and herbs, applesauce and cinnamon (for squash and sweet potatoes), nonfat yogurt, salsa, and low-fat dressings for salads.  LOW-SATURATED FAT / LOW-FAT FOOD SUBSTITUTES Meats / Saturated Fat (g)  Avoid: Steak, marbled (3 oz/85 g) / 11 g   Choose: Steak, lean (3 oz/85 g) / 4 g   Avoid: Hamburger (3 oz/85 g) / 7 g   Choose: Hamburger, lean (3 oz/85 g) / 5 g   Avoid: Ham (3 oz/85 g) / 6 g   Choose: Ham, lean cut (3 oz/85 g) / 2.4 g   Avoid: Chicken, with skin, dark meat (3 oz/85 g) / 4 g   Choose: Chicken, skin removed, dark meat (3 oz/85 g) / 2 g   Avoid: Chicken, with skin, light meat (3 oz/85 g) / 2.5 g   Choose: Chicken, skin removed, light meat (3 oz/85 g) / 1 g  Dairy / Saturated Fat (g)  Avoid: Whole milk (1 cup) / 5 g   Choose: Low-fat milk, 2% (1 cup) / 3 g   Choose: Low-fat milk, 1% (1 cup) / 1.5 g   Choose: Skim milk (1 cup) / 0.3 g   Avoid: Hard cheese (1 oz/28 g) / 6 g   Choose: Skim milk cheese (1 oz/28 g) / 2 to 3 g   Avoid: Cottage cheese, 4% fat (1 cup) / 6.5 g   Choose: Low-fat cottage cheese, 1% fat (1 cup) / 1.5 g   Avoid: Ice cream (1 cup) / 9 g   Choose: Sherbet (1 cup) / 2.5 g   Choose: Nonfat frozen yogurt (1 cup) / 0.3 g    Choose: Frozen fruit bar / trace   Avoid: Whipped cream (1 tbs) / 3.5 g   Choose: Nondairy whipped topping (1 tbs) / 1 g  Condiments / Saturated Fat (g)  Avoid: Mayonnaise (1 tbs) / 2 g   Choose: Low-fat mayonnaise (1 tbs) / 1 g   Avoid:   Butter (1 tbs) / 7 g   Choose: Extra light margarine (1 tbs) / 1 g   Avoid: Coconut oil (1 tbs) / 11.8 g   Choose: Olive oil (1 tbs) / 1.8 g   Choose: Corn oil (1 tbs) / 1.7 g   Choose: Safflower oil (1 tbs) / 1.2 g   Choose: Sunflower oil (1 tbs) / 1.4 g   Choose: Soybean oil (1 tbs) / 2.4 g   Choose: Canola oil (1 tbs) / 1 g  Exercise to Lose Weight Exercise and a healthy diet may help you lose weight. Your doctor may suggest specific exercises. EXERCISE IDEAS AND TIPS Choose low-cost things you enjoy doing, such as walking, bicycling, or exercising to workout videos.  Take stairs instead of the elevator.  Walk during your lunch break.  Park your car further away from work or school.  Go to a gym or an exercise class.  Start with 5 to 10 minutes of exercise each day. Build up to 30 minutes of exercise 4 to 6 days a week.  Wear shoes with good support and comfortable clothes.  Stretch before and after working out.  Work out until you breathe harder and your heart beats faster.  Drink extra water when you exercise.  Do not do so much that you hurt yourself, feel dizzy, or get very short of breath.  Exercises that burn about 150 calories: Running 1  miles in 15 minutes.  Playing volleyball for 45 to 60 minutes.  Washing and waxing a car for 45 to 60 minutes.  Playing touch football for 45 minutes.  Walking 1  miles in 35 minutes.  Pushing a stroller 1  miles in 30 minutes.  Playing basketball for 30 minutes.  Raking leaves for 30 minutes.  Bicycling 5 miles in 30 minutes.  Walking 2 miles in 30 minutes.  Dancing for 30 minutes.  Shoveling snow for 15 minutes.  Swimming laps for 20 minutes.  Walking up stairs for 15  minutes.  Bicycling 4 miles in 15 minutes.  Gardening for 30 to 45 minutes.  Jumping rope for 15 minutes.  Washing windows or floors for 45 to 60 minutes.  Document Released: 06/02/2010 Document Revised: 01/10/2011 Document Reviewed: 06/02/2010 [facility] Patient Information 2012 [place], [place].   Transvaginal Ultrasound Transvaginal ultrasound is a pelvic ultrasound, using a metal probe that is placed in the vagina, to look at a women's female organs. Transvaginal ultrasound is a method of seeing inside the pelvis of a woman. The ultrasound machine sends out sound waves from the transducer (probe). These sound waves bounce off body structures (like an echo) to create a picture. The picture shows up on a monitor. It is called transvaginal because the probe is inserted into the vagina. There should be very little discomfort from the vaginal probe. This test can also be used during pregnancy. Endovaginal ultrasound is another name for a transvaginal ultrasound. In a transabdominal ultrasound, the probe is placed on the outside of the belly. This method gives pictures that are lower quality than pictures from the transvaginal technique. Transvaginal ultrasound is used to look for problems of the female genital tract. Some such problems include:  Infertility problems.  Congenital (birth defect) malformations of the uterus and ovaries.  Tumors in the uterus.  Abnormal bleeding.  Ovarian tumors and cysts.  Abscess (inflamed tissue around pus) in the pelvis.  Unexplained abdominal or pelvic pain.  Pelvic infection. DURING PREGNANCY, TRANSVAGINAL ULTRASOUND MAY BE USED TO LOOK  AT:  Normal pregnancy.  Ectopic pregnancy (pregnancy outside the uterus).  Fetal heartbeat.  Abnormalities in the pelvis, that are not seen well with transabdominal ultrasound.  Suspected twins or multiples.  Impending miscarriage.  Problems with the cervix (incompetent cervix, not able to stay closed and hold  the baby).  When doing an amniocentesis (removing fluid from the pregnancy sac, for testing).  Looking for abnormalities of the baby.  Checking the growth, development, and age of the fetus.  Measuring the amount of fluid in the amniotic sac.  When doing an external version of the baby (moving baby into correct position).  Evaluating the baby for problems in high risk pregnancies (biophysical profile).  Suspected fetal demise (death). Sometimes a special ultrasound method called Saline Infusion Sonography (SIS) is used for a more accurate look at the uterus. Sterile saline (salt water) is injected into the uterus of non-pregnant patients to see the inside of the uterus better. SIS is not used on pregnant women. The vaginal probe can also assist in obtaining biopsies of abnormal areas, in draining fluid from cysts on the ovary, and in finding IUDs (intrauterine device, birth control) that cannot be located. PREPARATION FOR TEST A transvaginal ultrasound is done with the bladder empty. The transabdominal ultrasound is done with your bladder full. You may be asked to drink several glasses of water before that exam. Sometimes, a transabdominal ultrasound is done just after a transvaginal ultrasound, to look at organs in your abdomen. PROCEDURE  You will lie down on a table, with your knees bent and your feet in foot holders. The probe is covered with a condom. A sterile lubricant is put into the vagina and on the probe. The lubricant helps transmit the sound waves and avoid irritating the vagina. Your caregiver will move the probe inside the vaginal cavity to scan the pelvic structures. A normal test will show a normal pelvis and normal contents. An abnormal test will show abnormalities of the pelvis, placenta, or baby. ABNORMAL RESULTS MAY BE DUE TO:  Growths or tumors in the:  Uterus.  Ovaries.  Vagina.  Other pelvic structures.  Non-cancerous growths of the uterus and  ovaries.  Twisting of the ovary, cutting off blood supply to the ovary (ovarian torsion).  Areas of infection, including:  Pelvic inflammatory disease.  Abscess in the pelvis.  Locating an IUD. PROBLEMS FOUND IN PREGNANT WOMEN MAY INCLUDE:  Ectopic pregnancy (pregnancy outside the uterus).  Multiple pregnancies.  Early dilation (opening) of the cervix. This may indicate an incompetent cervix and early delivery.  Impending miscarriage.  Fetal death.  Problems with the placenta, including:  Placenta has grown over the opening of the womb (placenta previa).  Placenta has separated early in the womb (placental abruption).  Placenta grows into the muscle of the uterus (placenta accreta).  Tumors of pregnancy, including gestational trophoblastic disease. This is an abnormal pregnancy, with no fetus. The uterus is filled with many grape-like cysts that could sometimes be cancerous.  Incorrect position of the fetus (breech, vertex).  Intrauterine fetal growth retardation (IUGR) (poor growth in the womb).  Fetal abnormalities or infection. RISKS AND COMPLICATIONS There are no known risks to the ultrasound procedure. There is no X-ray used when doing an ultrasound. Document Released: 04/11/2004 Document Revised: 07/23/2011 Document Reviewed: 03/30/2009 [facility] Patient Information 2014 [place], [place].

## 2013-02-09 ENCOUNTER — Ambulatory Visit (INDEPENDENT_AMBULATORY_CARE_PROVIDER_SITE_OTHER): Payer: Medicare Other | Admitting: Gynecology

## 2013-02-09 ENCOUNTER — Encounter: Payer: Self-pay | Admitting: Gynecology

## 2013-02-09 ENCOUNTER — Ambulatory Visit (INDEPENDENT_AMBULATORY_CARE_PROVIDER_SITE_OTHER): Payer: Medicare Other

## 2013-02-09 VITALS — BP 136/84

## 2013-02-09 DIAGNOSIS — N949 Unspecified condition associated with female genital organs and menstrual cycle: Secondary | ICD-10-CM

## 2013-02-09 DIAGNOSIS — N83339 Acquired atrophy of ovary and fallopian tube, unspecified side: Secondary | ICD-10-CM

## 2013-02-09 DIAGNOSIS — N9489 Other specified conditions associated with female genital organs and menstrual cycle: Secondary | ICD-10-CM

## 2013-02-09 NOTE — Progress Notes (Signed)
Patient is 72 year old was seen in the office September 15 for annual gynecological examination. Earlier this year patient had a transvaginal hysterectomy with anterior colporrhaphy as a result of first-degree uterine prolapse and second-degree cystocele. Her ovaries were atrophic and were not seen so bilateral salpingo-oophorectomy was not done. She is asymptomatic and doing well otherwise. Her pathology reported benign. She is currently on estradiol 0.02% transvaginally twice a week for vaginal atrophy and has done well. Her PCP is Dr. Doristine Counter who has been monitoring her type 2 diabetes and hypertension. At time of her annual exam she was felt to have a left adnexal fullness and was asked to return for an ultrasound which she did today.  Ultrasound today: Absent uterus right (normal echo pattern atrophic. No apparent masses seen.  Assessment/plan: Adnexal fullness at time of annual exam may have been segment of bowel. Ultrasound negative. Patient will continue her estrogen vaginal cream twice a week. Patient has resumed full normal activity and doing well. Patient returned back to the office in 1 year or when necessary.

## 2013-02-16 ENCOUNTER — Other Ambulatory Visit: Payer: Self-pay

## 2013-02-16 DIAGNOSIS — Z1231 Encounter for screening mammogram for malignant neoplasm of breast: Secondary | ICD-10-CM

## 2013-02-17 ENCOUNTER — Other Ambulatory Visit: Payer: Medicare Other | Admitting: Anesthesiology

## 2013-02-17 DIAGNOSIS — Z1211 Encounter for screening for malignant neoplasm of colon: Secondary | ICD-10-CM

## 2013-02-23 ENCOUNTER — Telehealth: Payer: Self-pay | Admitting: *Deleted

## 2013-02-23 NOTE — Telephone Encounter (Signed)
Pt requested OCL result negative. Faxed to Dr Marjory Lies as requested by patient. KW

## 2013-03-13 ENCOUNTER — Ambulatory Visit
Admission: RE | Admit: 2013-03-13 | Discharge: 2013-03-13 | Disposition: A | Discharge status: Home / Self Care | Payer: Medicare Other | Source: Ambulatory Visit

## 2013-03-13 DIAGNOSIS — Z1231 Encounter for screening mammogram for malignant neoplasm of breast: Secondary | ICD-10-CM

## 2013-03-19 ENCOUNTER — Other Ambulatory Visit: Payer: Self-pay

## 2013-06-08 ENCOUNTER — Other Ambulatory Visit: Payer: Self-pay | Admitting: Gynecology

## 2014-01-29 ENCOUNTER — Ambulatory Visit (INDEPENDENT_AMBULATORY_CARE_PROVIDER_SITE_OTHER): Payer: Medicare Other | Admitting: Gynecology

## 2014-01-29 ENCOUNTER — Encounter: Payer: Self-pay | Admitting: Gynecology

## 2014-01-29 VITALS — BP 134/80 | Ht 65.25 in | Wt 176.0 lb

## 2014-01-29 DIAGNOSIS — N9489 Other specified conditions associated with female genital organs and menstrual cycle: Secondary | ICD-10-CM

## 2014-01-29 DIAGNOSIS — N952 Postmenopausal atrophic vaginitis: Secondary | ICD-10-CM

## 2014-01-29 DIAGNOSIS — N6320 Unspecified lump in the left breast, unspecified quadrant: Secondary | ICD-10-CM | POA: Insufficient documentation

## 2014-01-29 DIAGNOSIS — N63 Unspecified lump in unspecified breast: Secondary | ICD-10-CM

## 2014-01-29 DIAGNOSIS — Z79890 Hormone replacement therapy (postmenopausal): Secondary | ICD-10-CM

## 2014-01-29 DIAGNOSIS — N898 Other specified noninflammatory disorders of vagina: Secondary | ICD-10-CM

## 2014-01-29 MED ORDER — NONFORMULARY OR COMPOUNDED ITEM: Status: DC

## 2014-01-29 NOTE — Progress Notes (Signed)
Tamara Anthony Mar 09, 1941 [id number]   History:    73 y.o.  For GYN followup.Patient was status post transvaginal hysterectomy with anterior colporrhaphy as a result of first-degree uterine prolapse and second-degree cystocele. Her ovaries were atrophic and were not seen so bilateral salpingo-oophorectomy was not done. She is asymptomatic and doing well otherwise. Her pathology reported benign. She is currently on estradiol 0.02% transvaginally twice a week for vaginal atrophy and has done well. Her PCP is Dr. Tollie Pizza who has been monitoring her type 2 diabetes and hypertension. The patient exercises regularly and is taking her calcium and vitamin D. She had a normal bone density study in 2013. Patient refuses her shingles vaccine. Tdap vaccine up-to-date. Last colonoscopy 5 years ago. Patient with past history of benign colon polyps.   Past medical history,surgical history, family history and social history were all reviewed and documented in the EPIC chart.  Gynecologic History No LMP recorded. Patient is postmenopausal. Contraception: status post hysterectomy Last Pap: Many years ago. Results were: normal Last mammogram: 2014. Results were: normal  Obstetric History OB History  Gravida Para Term Preterm AB SAB TAB Ectopic Multiple Living  2 2 2       2     # Outcome Date GA Lbr Len/2nd Weight Sex Delivery Anes PTL Lv  2 TRM     F SVD  N Y  1 TRM     F SVD  N Y       ROS: A ROS was performed and pertinent positives and negatives are included in the history.  GENERAL: No fevers or chills. HEENT: No change in vision, no earache, sore throat or sinus congestion. NECK: No pain or stiffness. CARDIOVASCULAR: No chest pain or pressure. No palpitations. PULMONARY: No shortness of breath, cough or wheeze. GASTROINTESTINAL: No abdominal pain, nausea, vomiting or diarrhea, melena or bright red blood per rectum. GENITOURINARY: No urinary frequency, urgency, hesitancy or dysuria. MUSCULOSKELETAL: No  joint or muscle pain, no back pain, no recent trauma. DERMATOLOGIC: No rash, no itching, no lesions. ENDOCRINE: No polyuria, polydipsia, no heat or cold intolerance. No recent change in weight. HEMATOLOGICAL: No anemia or easy bruising or bleeding. NEUROLOGIC: No headache, seizures, numbness, tingling or weakness. PSYCHIATRIC: No depression, no loss of interest in normal activity or change in sleep pattern.     Exam: chaperone present  BP 134/80  Ht 5' 5.25" (1.657 m)  Wt 176 lb (79.833 kg)  BMI 29.08 kg/m2  Body mass index is 29.08 kg/(m^2).  General appearance : Well developed well nourished female. No acute distress HEENT: Neck supple, trachea midline, no carotid bruits, no thyroidmegaly Lungs: Clear to auscultation, no rhonchi or wheezes, or rib retractions  Heart: Regular rate and rhythm, no murmurs or gallops Breast:Physical Exam  Pulmonary/Chest:      Abdomen: no palpable masses or tenderness, no rebound or guarding Extremities: no edema or skin discoloration or tenderness  Pelvic:  Bartholin, Urethra, Skene Glands: Within normal limits             Vagina: Nodularity noted at the vaginal cuff  Cervix: Absent  Uterus  absent  Adnexa  Without masses or tenderness  Anus and perineum  normal   Rectovaginal  normal sphincter tone without palpated masses or tenderness             Hemoccult PCP provides     Assessment/Plan:  73 y.o. female for annual exam will be referred for a diagnostic mammogram of the incidental finding of a  left breast as noted above. Patient also scheduled for a followup ultrasound here in the office and indurated area noted at the vaginal cuff. Patient is also overdue for her bone density as well as her colonoscopy. She does have history of benign colon polyps over 5 years ago. Prescription refill for vaginal estrogen to apply twice a week. Her PCP will be doing her blood work. She declined vaccine.  Note: This dictation was prepared with   Dragon/digital dictation along withSmart phrase technology. Any transcriptional errors that result from this process are unintentional.   Terrance Mass MD, 12:04 PM 01/29/2014

## 2014-01-29 NOTE — Patient Instructions (Signed)

## 2014-02-01 ENCOUNTER — Telehealth: Payer: Self-pay | Admitting: *Deleted

## 2014-02-01 DIAGNOSIS — N6320 Unspecified lump in the left breast, unspecified quadrant: Secondary | ICD-10-CM

## 2014-02-01 NOTE — Telephone Encounter (Signed)
Orders placed at breast center they will contact pt to schedule. 

## 2014-02-01 NOTE — Telephone Encounter (Signed)
Message copied by Thamas Jaegers on Mon Feb 01, 2014  9:14 AM ------      Message from: Terrance Mass      Created: Fri Jan 29, 2014 12:02 PM       Please schedule diagnostic mammogram for this patient who has a left breast mass at the 11:00 position 4 fingerbreadths from the areolar region which is 1-2 cm in size firm nontender ------

## 2014-02-02 NOTE — Telephone Encounter (Signed)
Appointment 02/05/14 @ 9:45am

## 2014-02-09 ENCOUNTER — Other Ambulatory Visit: Payer: Self-pay | Admitting: Gynecology

## 2014-02-09 ENCOUNTER — Ambulatory Visit
Admission: RE | Admit: 2014-02-09 | Discharge: 2014-02-09 | Disposition: A | Discharge status: Home / Self Care | Payer: Medicare Other | Source: Ambulatory Visit | Attending: Gynecology | Admitting: Gynecology

## 2014-02-09 DIAGNOSIS — N898 Other specified noninflammatory disorders of vagina: Secondary | ICD-10-CM

## 2014-02-09 DIAGNOSIS — N6320 Unspecified lump in the left breast, unspecified quadrant: Secondary | ICD-10-CM

## 2014-02-10 ENCOUNTER — Other Ambulatory Visit: Payer: Self-pay | Admitting: Gynecology

## 2014-02-10 DIAGNOSIS — N6320 Unspecified lump in the left breast, unspecified quadrant: Secondary | ICD-10-CM

## 2014-03-01 ENCOUNTER — Encounter: Payer: Self-pay | Admitting: Gynecology

## 2014-03-01 ENCOUNTER — Ambulatory Visit (INDEPENDENT_AMBULATORY_CARE_PROVIDER_SITE_OTHER): Payer: Medicare Other

## 2014-03-01 ENCOUNTER — Other Ambulatory Visit: Payer: Self-pay | Admitting: Gynecology

## 2014-03-01 ENCOUNTER — Ambulatory Visit (INDEPENDENT_AMBULATORY_CARE_PROVIDER_SITE_OTHER): Payer: Medicare Other | Admitting: Gynecology

## 2014-03-01 DIAGNOSIS — N83319 Acquired atrophy of ovary, unspecified side: Secondary | ICD-10-CM

## 2014-03-01 DIAGNOSIS — N898 Other specified noninflammatory disorders of vagina: Secondary | ICD-10-CM

## 2014-03-01 DIAGNOSIS — N9489 Other specified conditions associated with female genital organs and menstrual cycle: Secondary | ICD-10-CM

## 2014-03-01 DIAGNOSIS — R339 Retention of urine, unspecified: Secondary | ICD-10-CM

## 2014-03-01 DIAGNOSIS — R1900 Intra-abdominal and pelvic swelling, mass and lump, unspecified site: Secondary | ICD-10-CM

## 2014-03-01 DIAGNOSIS — N8331 Acquired atrophy of ovary: Secondary | ICD-10-CM

## 2014-03-01 NOTE — Patient Instructions (Signed)

## 2014-03-01 NOTE — Progress Notes (Signed)
   Patient is a 73 year old was seen the office for her annual exam last month. Patient with past history of transvaginal hysterectomy with anterior colporrhaphy as a result of first-degree uterine prolapse and second-degree cystocele. Her ovaries were atrophic and were not seen so bilateral salpingo-oophorectomy was not done. During her exam:  #1 vaginal cuff nodularity was noted some ultrasound was done the following findings: Vaginal cuff no masses seen no and masses left ovary not seen excess no apparent masses seen there is no fluid in the cul-de-sac.  #2 left breast mass 11:00 position two  fingerbreath  from areolar region 2 cm in size and firm and nontender. Patient was diagnostic mammogram and she is now scheduled for stereotactic biopsy later this month.   Scan results and interpretation by radiologist as follows:  IMPRESSION:  1.4 x 1.0 x 0.9 cm irregular, markedly hypoechoic mass like area  with dense posterior acoustical shadowing in the 10:30 o'clock  position of the left breast, corresponding to the palpable mass.  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 However, malignancy cannot be excluded.  Therefore, ultrasound-guided core needle biopsy is recommended. This  has been discussed with the patient and she agrees.  RECOMMENDATION:  Left breast ultrasound-guided core needle biopsy. This has been  scheduled for 9 a.m. on 03/03/2014.  Assessment/plan: Vaginal cuff fullness annual exam with to bowel. Ultrasound normal. Patient to followup in the month with her core needle biopsy of breast mass. Patient also to schedule her bone density November. She is to contact her gastroenterologist for her next colonoscopy.

## 2014-03-03 ENCOUNTER — Other Ambulatory Visit: Payer: Medicare Other

## 2014-03-04 ENCOUNTER — Other Ambulatory Visit: Payer: Medicare Other

## 2014-03-08 ENCOUNTER — Ambulatory Visit
Admission: RE | Admit: 2014-03-08 | Discharge: 2014-03-08 | Disposition: A | Discharge status: Home / Self Care | Payer: Medicare Other | Source: Ambulatory Visit | Attending: Gynecology | Admitting: Gynecology

## 2014-03-08 ENCOUNTER — Other Ambulatory Visit: Payer: Self-pay | Admitting: Gynecology

## 2014-03-08 DIAGNOSIS — N6320 Unspecified lump in the left breast, unspecified quadrant: Secondary | ICD-10-CM

## 2014-03-09 ENCOUNTER — Telehealth: Payer: Self-pay | Admitting: *Deleted

## 2014-03-09 DIAGNOSIS — M8580 Other specified disorders of bone density and structure, unspecified site: Secondary | ICD-10-CM

## 2014-03-09 DIAGNOSIS — Z780 Asymptomatic menopausal state: Secondary | ICD-10-CM

## 2014-03-09 DIAGNOSIS — E2839 Other primary ovarian failure: Secondary | ICD-10-CM

## 2014-03-09 NOTE — Telephone Encounter (Signed)
Pt called requesting bone density order placed at breast center, order placed for pt to schedule.

## 2014-03-09 NOTE — Addendum Note (Signed)
Addended by: Thamas Jaegers on: 03/09/2014 10:07 AM   Modules accepted: Orders

## 2014-03-15 ENCOUNTER — Encounter: Payer: Self-pay | Admitting: Gynecology

## 2014-03-19 ENCOUNTER — Ambulatory Visit
Admission: RE | Admit: 2014-03-19 | Discharge: 2014-03-19 | Disposition: A | Discharge status: Home / Self Care | Payer: Medicare Other | Source: Ambulatory Visit | Attending: Gynecology | Admitting: Gynecology

## 2014-03-19 DIAGNOSIS — E2839 Other primary ovarian failure: Secondary | ICD-10-CM

## 2014-03-25 ENCOUNTER — Telehealth: Payer: Self-pay | Admitting: *Deleted

## 2014-03-25 NOTE — Telephone Encounter (Signed)
Left message for pt to call.

## 2014-03-25 NOTE — Telephone Encounter (Signed)
Please inform her that her bone density study was normal. When compared with previous study back in 2000 for there was some decreased bone mass of the left hip but overall her scores completely normal she should continue with her calcium and vitamin D and regular exercise as she is doing

## 2014-03-25 NOTE — Telephone Encounter (Signed)
Pt would like to know if any recommendations for her recent bone density results?

## 2014-03-25 NOTE — Telephone Encounter (Signed)
Pt informed with the below note. 

## 2014-04-01 ENCOUNTER — Other Ambulatory Visit: Payer: Self-pay

## 2014-04-01 DIAGNOSIS — Z1231 Encounter for screening mammogram for malignant neoplasm of breast: Secondary | ICD-10-CM

## 2014-04-30 ENCOUNTER — Ambulatory Visit
Admission: RE | Admit: 2014-04-30 | Discharge: 2014-04-30 | Disposition: A | Discharge status: Home / Self Care | Payer: Medicare Other | Source: Ambulatory Visit

## 2014-04-30 DIAGNOSIS — Z1231 Encounter for screening mammogram for malignant neoplasm of breast: Secondary | ICD-10-CM

## 2014-08-05 ENCOUNTER — Ambulatory Visit: Payer: Self-pay | Admitting: Orthopedic Surgery

## 2014-08-05 NOTE — H&P (Signed)
Tamara Anthony DOB: 1940/07/12 Married / Language: English / Race: White Female Date of Admission: 08/05/2014 CC: Left Knee Pain History of Present Illness The patient is a 74 year old female who comes in for a preoperative History and Physical. The patient is scheduled for a left total knee arthroplasty to be performed by Dr. Dione Plover. Aluisio, MD at [facility] on 08-30-2014. The patient is a 74 year old female who presents with knee complaints. The patient was seen for a surgical consult and in referral from Dr. Theda Sers. The patient reports left knee and right knee symptoms which began year(s) ago without any known injury. Prior to being seen, the patient was previously evaluated by a colleague. Previous work-up for this problem has included knee x-rays. Past treatment for this problem has included intra-articular injection of corticosteroids. Symptoms are reported to be located in the left knee and right knee (Left > Right) and include knee pain. She states that the left knee is becoming more problematic for her. She has had multiple injections including cortisone and Visco supplements. She does not hurt all of the time. She generally rests well. She does not feel like she can trust the knee. She feels like it wants to give out on her. She is still playing pickleball, but is having an increasingly difficult time doing so. She is ready to get the knee fixed at this time. They have been treated conservatively in the past for the above stated problem and despite conservative measures, they continue to have progressive pain and severe functional limitations and dysfunction. They have failed non-operative management including home exercise, medications, and injections. It is felt that they would benefit from undergoing total joint replacement. Risks and benefits of the procedure have been discussed with the patient and they elect to proceed with surgery. There are no active contraindications to surgery  such as ongoing infection or rapidly progressive neurological disease.  Allergies No Known Drug Allergies  Past Medical History Osteoarthritis High blood pressure Hypercholesterolemia Primary localized osteoarthritis of left knee (M17.12) Shingles Hemorrhoids Urinary Tract Infection Past History Diet-Controlled Diabetes Mellitus Menopause  Family History  Heart disease in female family member before age 54 Heart Disease Mother. Osteoarthritis Father. Hypertension Father. Cerebrovascular Accident Mother. mother Cancer Father. First Degree Relatives reported Depression Mother. mother  Social History  Not under pain contract No history of drug/alcohol rehab Marital status married Tobacco use Former smoker, Never smoker. 02/10/2014: smoke(d) less than 1/2 pack(s) per day Tobacco / smoke exposure 02/10/2014: no no Number of flights of stairs before winded 4-5 Current drinker 02/10/2014: Currently drinks beer, wine and hard liquor only occasionally per week Children 2 Alcohol use Occasional alcohol use. 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 Crestor (10MG  Tablet, Oral) Active. (3 x per week) Hydrochlorothiazide (25MG  Tablet, Oral) Active. Quinapril HCl (40MG  Tablet, Oral) Active. Medications Reconciled   Past Surgical History Colon Polyp Removal - Colonoscopy Tonsillectomy Date: 1949. Hysterectomy Date: 03/2012. partial (non-cancerous)   Review of Systems General Not Present- Chills, Fatigue, Fever, Memory Loss, Night Sweats, Weight Gain and Weight Loss. Skin Not Present- Eczema, Hives, Itching, Lesions and Rash. HEENT Present- Dentures. Not Present- Double Vision,  Headache, Hearing Loss, Tinnitus and Visual Loss. Respiratory Not Present- Allergies, Chronic Cough, Coughing up blood, Shortness of breath  at rest and Shortness of breath with exertion. Cardiovascular Not Present- Chest Pain, Difficulty Breathing Lying Down, Murmur, Palpitations, Racing/skipping heartbeats and Swelling. Gastrointestinal Not Present- Abdominal Pain, Bloody Stool, Constipation, Diarrhea, Difficulty Swallowing, Heartburn, Jaundice, Loss of appetitie, Nausea and Vomiting. Female Genitourinary Not Present- Blood in Urine, Discharge, Flank Pain, Incontinence, Painful Urination, Urgency, Urinary frequency, Urinary Retention, Urinating at Night and Weak urinary stream. Musculoskeletal Present- Joint Pain. Not Present- Back Pain, Joint Swelling, Morning Stiffness, Muscle Pain, Muscle Weakness and Spasms. Neurological Not Present- Blackout spells, Difficulty with balance, Dizziness, Paralysis, Tremor and Weakness. Psychiatric Not Present- Insomnia.   Vitals Weight: 180 lb Height: 64.75in Weight was reported by patient. Height was reported by patient. Body Surface Area: 1.89 m Body Mass Index: 30.19 kg/m  BP: 152/72 (Sitting, Left Arm, Standard)   Physical Exam  General Mental Status -Alert, cooperative and good historian. General Appearance-pleasant, Not in acute distress. Orientation-Oriented X3. Build & Nutrition-Well nourished and Well developed.  Head and Neck Head-normocephalic, atraumatic . Neck Global Assessment - supple, no bruit auscultated on the right, no bruit auscultated on the left.  Eye Vision-Wears corrective lenses. Pupil - Bilateral-Regular and Round. Motion - Bilateral-EOMI.  ENMT Note: upper and lower dentures   Chest and Lung Exam Auscultation Breath sounds - clear at anterior chest wall and clear at posterior chest wall. Adventitious sounds - No Adventitious sounds.  Cardiovascular Auscultation Rhythm - Regular rate  and rhythm. Heart Sounds - S1 WNL and S2 WNL. Murmurs & Other Heart Sounds - Auscultation of the heart reveals - No Murmurs.  Abdomen Palpation/Percussion Tenderness - Abdomen is non-tender to palpation. Rigidity (guarding) - Abdomen is soft. Auscultation Auscultation of the abdomen reveals - Bowel sounds normal.  Female Genitourinary Note: Not done, not pertinent to present illness   Musculoskeletal Note: On examination, she is alert and oriented in no apparent distress. Her hips show normal range of motion with no discomfort. Her right knee shows no effusion. There is moderate crepitus. On range of motion of the right knee, range is about 10 to 125. There is no instability. The left knee has no effusion, with range also about 10 to 125. There is moderate to marked crepitus on range of motion of the knee. When she flexes the knee, it almost feel like it is subluxing. There is no instability noted.  RADIOGRAPHS: Her radiographs AP of both knees and lateral views show severe end-stage arthritis of that left knee. Bone-on-bone medial and patellofemoral with tibial subluxation and massive osteophytes. The right knee is also arthritic, but to a lesser degree.   Assessment & Plan Primary osteoarthritis of left knee (M17.12) Note:Surgical Plans: Left Total Knee Replacement  Disposition: Home  PCP: Dr. Jeralene Huff - Patient has a preop clearance visit scheduled which is pending at this time of the H%P.  IV TXA  Anesthesia Issues: None  Signed electronically by Joelene Millin, III PA-C

## 2014-08-05 NOTE — H&P (Signed)
Tamara Anthony DOB: 11/11/1940 Married / Language: English / Race: White Female Date of Admission:  08/05/2014 CC:  Left Knee Pain History of Present Illness The patient is a 74 year old female who comes in for a preoperative History and Physical. The patient is scheduled for a left total knee arthroplasty to be performed by Dr. Dione Plover. Aluisio, MD at [facility] on 08-30-2014. The patient is a 74 year old female who presents with knee complaints. The patient was seen for a surgical consult and in referral from Dr. Theda Sers. The patient reports left knee and right knee symptoms which began year(s) ago without any known injury. Prior to being seen, the patient was previously evaluated by a colleague. Previous work-up for this problem has included knee x-rays. Past treatment for this problem has included intra-articular injection of corticosteroids. Symptoms are reported to be located in the left knee and right knee (Left > Right) and include knee pain. She states that the left knee is becoming more problematic for her. She has had multiple injections including cortisone and Visco supplements. She does not hurt all of the time. She generally rests well. She does not feel like she can trust the knee. She feels like it wants to give out on her. She is still playing pickleball, but is having an increasingly difficult time doing so. She is ready to get the knee fixed at this time. They have been treated conservatively in the past for the above stated problem and despite conservative measures, they continue to have progressive pain and severe functional limitations and dysfunction. They have failed non-operative management including home exercise, medications, and injections. It is felt that they would benefit from undergoing total joint replacement. Risks and benefits of the procedure have been discussed with the patient and they elect to proceed with surgery. There are no active contraindications to surgery  such as ongoing infection or rapidly progressive neurological disease.  Allergies No Known Drug Allergies  Past Medical History Osteoarthritis High blood pressure Hypercholesterolemia Primary localized osteoarthritis of left knee (M17.12) Shingles Hemorrhoids Urinary Tract Infection Past History Diet-Controlled Diabetes Mellitus Menopause  Family History  Heart disease in female family member before age 65 Heart Disease Mother. Osteoarthritis Father. Hypertension Father. Cerebrovascular Accident Mother. mother Cancer Father. First Degree Relatives reported Depression Mother. mother  Social History  Not under pain contract No history of drug/alcohol rehab Marital status married Tobacco use Former smoker, Never smoker. 02/10/2014: smoke(d) less than 1/2 pack(s) per day Tobacco / smoke exposure 02/10/2014: no no Number of flights of stairs before winded 4-5 Current drinker 02/10/2014: Currently drinks beer, wine and hard liquor only occasionally per week Children 2 Alcohol use Occasional alcohol use. 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 Crestor (10MG  Tablet, Oral) Active. (3 x per week) Hydrochlorothiazide (25MG  Tablet, Oral) Active. Quinapril HCl (40MG  Tablet, Oral) Active. Medications Reconciled   Past Surgical History Colon Polyp Removal - Colonoscopy Tonsillectomy Date: 1949. Hysterectomy Date: 03/2012. partial (non-cancerous)   Review of Systems General Not Present- Chills, Fatigue, Fever, Memory Loss, Night Sweats, Weight Gain and Weight Loss. Skin Not Present- Eczema, Hives, Itching, Lesions and Rash. HEENT Present- Dentures. Not Present- Double Vision,  Headache, Hearing Loss, Tinnitus and Visual Loss. Respiratory Not Present- Allergies, Chronic Cough, Coughing up blood, Shortness  of breath at rest and Shortness of breath with exertion. Cardiovascular Not Present- Chest Pain, Difficulty Breathing Lying Down, Murmur, Palpitations, Racing/skipping heartbeats and Swelling. Gastrointestinal Not Present- Abdominal Pain, Bloody Stool, Constipation, Diarrhea, Difficulty Swallowing, Heartburn, Jaundice, Loss of appetitie, Nausea and Vomiting. Female Genitourinary Not Present- Blood in Urine, Discharge, Flank Pain, Incontinence, Painful Urination, Urgency, Urinary frequency, Urinary Retention, Urinating at Night and Weak urinary stream. Musculoskeletal Present- Joint Pain. Not Present- Back Pain, Joint Swelling, Morning Stiffness, Muscle Pain, Muscle Weakness and Spasms. Neurological Not Present- Blackout spells, Difficulty with balance, Dizziness, Paralysis, Tremor and Weakness. Psychiatric Not Present- Insomnia.   Vitals Weight: 180 lb Height: 64.75in Weight was reported by patient. Height was reported by patient. Body Surface Area: 1.89 m Body Mass Index: 30.19 kg/m  BP: 152/72 (Sitting, Left Arm, Standard)   Physical Exam  General Mental Status -Alert, cooperative and good historian. General Appearance-pleasant, Not in acute distress. Orientation-Oriented X3. Build & Nutrition-Well nourished and Well developed.  Head and Neck Head-normocephalic, atraumatic . Neck Global Assessment - supple, no bruit auscultated on the right, no bruit auscultated on the left.  Eye Vision-Wears corrective lenses. Pupil - Bilateral-Regular and Round. Motion - Bilateral-EOMI.  ENMT Note: upper and lower dentures   Chest and Lung Exam Auscultation Breath sounds - clear at anterior chest wall and clear at posterior chest wall. Adventitious sounds - No Adventitious sounds.  Cardiovascular Auscultation Rhythm - Regular rate  and rhythm. Heart Sounds - S1 WNL and S2 WNL. Murmurs & Other Heart Sounds - Auscultation of the heart reveals - No Murmurs.  Abdomen Palpation/Percussion Tenderness - Abdomen is non-tender to palpation. Rigidity (guarding) - Abdomen is soft. Auscultation Auscultation of the abdomen reveals - Bowel sounds normal.  Female Genitourinary Note: Not done, not pertinent to present illness   Musculoskeletal Note: On examination, she is alert and oriented in no apparent distress. Her hips show normal range of motion with no discomfort. Her right knee shows no effusion. There is moderate crepitus. On range of motion of the right knee, range is about 10 to 125. There is no instability. The left knee has no effusion, with range also about 10 to 125. There is moderate to marked crepitus on range of motion of the knee. When she flexes the knee, it almost feel like it is subluxing. There is no instability noted.  RADIOGRAPHS: Her radiographs AP of both knees and lateral views show severe end-stage arthritis of that left knee. Bone-on-bone medial and patellofemoral with tibial subluxation and massive osteophytes. The right knee is also arthritic, but to a lesser degree.   Assessment & Plan Primary osteoarthritis of left knee (M17.12) Note:Surgical Plans: Left Total Knee Replacement  Disposition: Home  PCP: Dr. Jeralene Huff - Patient has a preop clearance visit scheduled which is pending at this time of the H%P.  IV TXA  Anesthesia Issues: None  Signed electronically by Joelene Millin, III PA-C

## 2014-08-17 ENCOUNTER — Ambulatory Visit: Payer: Self-pay | Admitting: Orthopedic Surgery

## 2014-08-17 NOTE — Progress Notes (Signed)
Preoperative surgical orders have been place into the Epic hospital system for Tamara Anthony on 08/17/2014, 2:48 PM  by Mickel Crow for surgery on 08-30-2014.  Preop Total Knee orders including Experal, IV Tylenol, and IV Decadron as long as there are no contraindications to the above medications. Arlee Muslim, PA-C

## 2014-08-24 NOTE — Patient Instructions (Addendum)
[place]  08/24/2014   Your procedure is scheduled on:   08-30-2014 Monday  Enter through [facility]  Entrance and follow signs to [facility]. Arrive at    0730    AM.  Call this number if you have problems the morning of surgery: [phone]  Or Presurgical Testing [phone].   For Living Will and/or Health Care Power Attorney Forms: please provide copy for your medical record,may bring AM of surgery(Forms should be already notarized -we do not provide this service).(08-25-14 Yes, packet given to admissions today).   Do not eat food/ or drink: After Midnight.    Take these medicines the morning of surgery with A SIP OF WATER: Carvedilol.   Do not wear jewelry, make-up or nail polish.  Do not wear deodorant, lotions, powders, or perfumes.   Do not shave legs and under arms- 48 hours(2 days) prior to first CHG shower.(Shaving face and neck okay.)  Do not bring valuables to the hospital.(Hospital is not responsible for lost valuables).  Contacts, dentures or removable bridgework, body piercing, hair pins may not be worn into surgery.  Leave suitcase in the car. After surgery it may be brought to your room.  For patients admitted to the hospital, checkout time is 11:00 AM the day of discharge.(Restricted visitors-Any Persons displaying flu-like symptoms or illness).    Patients discharged the day of surgery will not be allowed to drive home. Must have responsible person with you x 24 hours once discharged.  Name and phone number of your driver: Broadus John [phone]    Please read over the following fact sheets that you were given:  CHG(Chlorhexidine Gluconate 4% Surgical Soap) use, MRSA Information, Blood Transfusion fact sheet, Incentive Spirometry Instruction.  Remember : Type/Screen "Blue armbands" - may not be removed once applied(would result in being retested AM of surgery, if removed).         [place] - Preparing for Surgery Before surgery, you can play an  important role.  Because skin is not sterile, your skin needs to be as free of germs as possible.  You can reduce the number of germs on your skin by washing with CHG (chlorahexidine gluconate) soap before surgery.  CHG is an antiseptic cleaner which kills germs and bonds with the skin to continue killing germs even after washing. Please DO NOT use if you have an allergy to CHG or antibacterial soaps.  If your skin becomes reddened/irritated stop using the CHG and inform your nurse when you arrive at Short Stay. Do not shave (including legs and underarms) for at least 48 hours prior to the first CHG shower.  You may shave your face/neck. Please follow these instructions carefully:  1.  Shower with CHG Soap the night before surgery and the  morning of Surgery.  2.  If you choose to wash your hair, wash your hair first as usual with your  normal  shampoo.  3.  After you shampoo, rinse your hair and body thoroughly to remove the  shampoo.                           4.  Use CHG as you would any other liquid soap.  You can apply chg directly  to the skin and wash                       Gently with a scrungie or clean washcloth.  5.  Apply the CHG Soap to your body ONLY FROM THE NECK DOWN.   Do not use on face/ open                           Wound or open sores. Avoid contact with eyes, ears mouth and genitals (private parts).                       Wash face,  Genitals (private parts) with your normal soap.             6.  Wash thoroughly, paying special attention to the area where your surgery  will be performed.  7.  Thoroughly rinse your body with warm water from the neck down.  8.  DO NOT shower/wash with your normal soap after using and rinsing off  the CHG Soap.                9.  Pat yourself dry with a clean towel.            10.  Wear clean pajamas.            11.  Place clean sheets on your bed the night of your first shower and do not  sleep with pets. Day of Surgery : Do not apply any  lotions/deodorants the morning of surgery.  Please wear clean clothes to the hospital/surgery center.  FAILURE TO FOLLOW THESE INSTRUCTIONS MAY RESULT IN THE CANCELLATION OF YOUR SURGERY PATIENT SIGNATURE_________________________________  NURSE SIGNATURE__________________________________  ________________________________________________________________________   Tamara Anthony  An incentive spirometer is a tool that can help keep your lungs clear and active. This tool measures how well you are filling your lungs with each breath. Taking long deep breaths may help reverse or decrease the chance of developing breathing (pulmonary) problems (especially infection) following:  A long period of time when you are unable to move or be active. BEFORE THE PROCEDURE   If the spirometer includes an indicator to show your best effort, your nurse or respiratory therapist will set it to a desired goal.  If possible, sit up straight or lean slightly forward. Try not to slouch.  Hold the incentive spirometer in an upright position. INSTRUCTIONS FOR USE   Sit on the edge of your bed if possible, or sit up as far as you can in bed or on a chair.  Hold the incentive spirometer in an upright position.  Breathe out normally.  Place the mouthpiece in your mouth and seal your lips tightly around it.  Breathe in slowly and as deeply as possible, raising the piston or the ball toward the top of the column.  Hold your breath for 3-5 seconds or for as long as possible. Allow the piston or ball to fall to the bottom of the column.  Remove the mouthpiece from your mouth and breathe out normally.  Rest for a few seconds and repeat Steps 1 through 7 at least 10 times every 1-2 hours when you are awake. Take your time and take a few normal breaths between deep breaths.  The spirometer may include an indicator to show your best effort. Use the indicator as a goal to work toward during each  repetition.  After each set of 10 deep breaths, practice coughing to be sure your lungs are clear. If you have an incision (the cut made at the time of surgery), support your incision when coughing by placing a pillow  or rolled up towels firmly against it. Once you are able to get out of bed, walk around indoors and cough well. You may stop using the incentive spirometer when instructed by your caregiver.  RISKS AND COMPLICATIONS  Take your time so you do not get dizzy or light-headed.  If you are in pain, you may need to take or ask for pain medication before doing incentive spirometry. It is harder to take a deep breath if you are having pain. AFTER USE  Rest and breathe slowly and easily.  It can be helpful to keep track of a log of your progress. Your caregiver can provide you with a simple table to help with this. If you are using the spirometer at home, follow these instructions: [place] IF:   You are having difficultly using the spirometer.  You have trouble using the spirometer as often as instructed.  Your pain medication is not giving enough relief while using the spirometer.  You develop fever of 100.5 F (38.1 C) or higher. SEEK IMMEDIATE MEDICAL CARE IF:   You cough up bloody sputum that had not been present before.  You develop fever of 102 F (38.9 C) or greater.  You develop worsening pain at or near the incision site. MAKE SURE YOU:   Understand these instructions.  Will watch your condition.  Will get help right away if you are not doing well or get worse. Document Released: 09/10/2006 Document Revised: 07/23/2011 Document Reviewed: 11/11/2006 [employer] Patient Information 2014 [employer], [place].   ________________________________________________________________________  WHAT IS A BLOOD TRANSFUSION? Blood Transfusion Information  A transfusion is the replacement of blood or some of its parts. Blood is made up of multiple cells which provide  different functions.  Red blood cells carry oxygen and are used for blood loss replacement.  White blood cells fight against infection.  Platelets control bleeding.  Plasma helps clot blood.  Other blood products are available for specialized needs, such as hemophilia or other clotting disorders. BEFORE THE TRANSFUSION  Who gives blood for transfusions?   Healthy volunteers who are fully evaluated to make sure their blood is safe. This is blood bank blood. Transfusion therapy is the safest it has ever been in the practice of medicine. Before blood is taken from a donor, a complete history is taken to make sure that person has no history of diseases nor engages in risky social behavior (examples are intravenous drug use or sexual activity with multiple partners). The donor's travel history is screened to minimize risk of transmitting infections, such as malaria. The donated blood is tested for signs of infectious diseases, such as HIV and hepatitis. The blood is then tested to be sure it is compatible with you in order to minimize the chance of a transfusion reaction. If you or a relative donates blood, this is often done in anticipation of surgery and is not appropriate for emergency situations. It takes many days to process the donated blood. RISKS AND COMPLICATIONS Although transfusion therapy is very safe and saves many lives, the main dangers of transfusion include:   Getting an infectious disease.  Developing a transfusion reaction. This is an allergic reaction to something in the blood you were given. Every precaution is taken to prevent this. The decision to have a blood transfusion has been considered carefully by your caregiver before blood is given. Blood is not given unless the benefits outweigh the risks. AFTER THE TRANSFUSION  Right after receiving a blood transfusion, you will usually  feel much better and more energetic. This is especially true if your red blood cells have  gotten low (anemic). The transfusion raises the level of the red blood cells which carry oxygen, and this usually causes an energy increase.  The nurse administering the transfusion will monitor you carefully for complications. HOME CARE INSTRUCTIONS  No special instructions are needed after a transfusion. You may find your energy is better. Speak with your caregiver about any limitations on activity for underlying diseases you may have. SEEK MEDICAL CARE IF:   Your condition is not improving after your transfusion.  You develop redness or irritation at the intravenous (IV) site. SEEK IMMEDIATE MEDICAL CARE IF:  Any of the following symptoms occur over the next 12 hours:  Shaking chills.  You have a temperature by mouth above 102 F (38.9 C), not controlled by medicine.  Chest, back, or muscle pain.  People around you feel you are not acting correctly or are confused.  Shortness of breath or difficulty breathing.  Dizziness and fainting.  You get a rash or develop hives.  You have a decrease in urine output.  Your urine turns a dark color or changes to pink, red, or brown. Any of the following symptoms occur over the next 10 days:  You have a temperature by mouth above 102 F (38.9 C), not controlled by medicine.  Shortness of breath.  Weakness after normal activity.  The white part of the eye turns yellow (jaundice).  You have a decrease in the amount of urine or are urinating less often.  Your urine turns a dark color or changes to pink, red, or brown. Document Released: 04/27/2000 Document Revised: 07/23/2011 Document Reviewed: 12/15/2007 [facility] Patient Information 2014 [place], [place].  _______________________________________________________________________

## 2014-08-25 ENCOUNTER — Encounter (HOSPITAL_COMMUNITY): Payer: Self-pay | Admitting: *Deleted

## 2014-08-25 ENCOUNTER — Encounter (HOSPITAL_COMMUNITY)
Admission: RE | Admit: 2014-08-25 | Discharge: 2014-08-25 | Disposition: A | Discharge status: Home / Self Care | Payer: Medicare Other | Source: Ambulatory Visit | Attending: Orthopedic Surgery | Admitting: Orthopedic Surgery

## 2014-08-25 DIAGNOSIS — M179 Osteoarthritis of knee, unspecified: Secondary | ICD-10-CM | POA: Insufficient documentation

## 2014-08-25 DIAGNOSIS — Z01818 Encounter for other preprocedural examination: Secondary | ICD-10-CM | POA: Insufficient documentation

## 2014-08-25 LAB — URINALYSIS, ROUTINE W REFLEX MICROSCOPIC
Bilirubin Urine: NEGATIVE
Glucose, UA: NEGATIVE mg/dL
Hgb urine dipstick: NEGATIVE
Ketones, ur: NEGATIVE mg/dL
LEUKOCYTES UA: NEGATIVE
NITRITE: NEGATIVE
PROTEIN: NEGATIVE mg/dL
SPECIFIC GRAVITY, URINE: 1.007 (ref 1.005–1.030)
UROBILINOGEN UA: 0.2 mg/dL (ref 0.0–1.0)
pH: 6 (ref 5.0–8.0)

## 2014-08-25 LAB — COMPREHENSIVE METABOLIC PANEL
ALT: 16 U/L (ref 0–35)
AST: 17 U/L (ref 0–37)
Albumin: 4.1 g/dL (ref 3.5–5.2)
Alkaline Phosphatase: 59 U/L (ref 39–117)
Anion gap: 5 (ref 5–15)
BUN: 22 mg/dL (ref 6–23)
CALCIUM: 9.5 mg/dL (ref 8.4–10.5)
CHLORIDE: 104 mmol/L (ref 96–112)
CO2: 29 mmol/L (ref 19–32)
Creatinine, Ser: 0.75 mg/dL (ref 0.50–1.10)
GFR calc non Af Amer: 83 mL/min — ABNORMAL LOW (ref >90)
GLUCOSE: 104 mg/dL — AB (ref 70–99)
POTASSIUM: 4.5 mmol/L (ref 3.5–5.1)
Sodium: 138 mmol/L (ref 135–145)
Total Bilirubin: 0.5 mg/dL (ref 0.3–1.2)
Total Protein: 7.3 g/dL (ref 6.0–8.3)

## 2014-08-25 LAB — CBC
HCT: 42.7 % (ref 36.0–46.0)
Hemoglobin: 14 g/dL (ref 12.0–15.0)
MCH: 29.2 pg (ref 26.0–34.0)
MCHC: 32.8 g/dL (ref 30.0–36.0)
MCV: 89.1 fL (ref 78.0–100.0)
PLATELETS: 266 10*3/uL (ref 150–400)
RBC: 4.79 MIL/uL (ref 3.87–5.11)
RDW: 13.1 % (ref 11.5–15.5)
WBC: 6.3 10*3/uL (ref 4.0–10.5)

## 2014-08-25 LAB — ABO/RH: ABO/RH(D): A POS

## 2014-08-25 LAB — PROTIME-INR
INR: 0.95 (ref 0.00–1.49)
PROTHROMBIN TIME: 12.8 s (ref 11.6–15.2)

## 2014-08-25 LAB — SURGICAL PCR SCREEN
MRSA, PCR: NEGATIVE
Staphylococcus aureus: NEGATIVE

## 2014-08-25 LAB — APTT: aPTT: 29 seconds (ref 24–37)

## 2014-08-25 NOTE — Pre-Procedure Instructions (Signed)
08/17/14 Medical clearance, EKG, labs, Dr. Jeralene Huff, on chart

## 2014-08-30 ENCOUNTER — Inpatient Hospital Stay (HOSPITAL_COMMUNITY): Payer: Medicare Other | Admitting: Registered Nurse

## 2014-08-30 ENCOUNTER — Inpatient Hospital Stay (HOSPITAL_COMMUNITY)
Admission: RE | Admit: 2014-08-30 | Discharge: 2014-09-01 | DRG: 470 | Disposition: A | Discharge status: Home Health | Payer: Medicare Other | Source: Ambulatory Visit | Attending: Orthopedic Surgery | Admitting: Orthopedic Surgery

## 2014-08-30 ENCOUNTER — Encounter (HOSPITAL_COMMUNITY): Payer: Self-pay | Admitting: *Deleted

## 2014-08-30 ENCOUNTER — Encounter (HOSPITAL_COMMUNITY)
Admission: RE | Disposition: A | Discharge status: Home Health | Payer: Self-pay | Source: Ambulatory Visit | Attending: Orthopedic Surgery

## 2014-08-30 DIAGNOSIS — M179 Osteoarthritis of knee, unspecified: Secondary | ICD-10-CM | POA: Diagnosis present

## 2014-08-30 DIAGNOSIS — M1712 Unilateral primary osteoarthritis, left knee: Secondary | ICD-10-CM | POA: Diagnosis present

## 2014-08-30 DIAGNOSIS — M1710 Unilateral primary osteoarthritis, unspecified knee: Secondary | ICD-10-CM | POA: Diagnosis present

## 2014-08-30 DIAGNOSIS — Z01812 Encounter for preprocedural laboratory examination: Secondary | ICD-10-CM

## 2014-08-30 DIAGNOSIS — Z87891 Personal history of nicotine dependence: Secondary | ICD-10-CM | POA: Diagnosis not present

## 2014-08-30 DIAGNOSIS — E119 Type 2 diabetes mellitus without complications: Secondary | ICD-10-CM | POA: Diagnosis present

## 2014-08-30 DIAGNOSIS — E785 Hyperlipidemia, unspecified: Secondary | ICD-10-CM | POA: Diagnosis present

## 2014-08-30 DIAGNOSIS — M25562 Pain in left knee: Secondary | ICD-10-CM | POA: Diagnosis present

## 2014-08-30 DIAGNOSIS — I10 Essential (primary) hypertension: Secondary | ICD-10-CM | POA: Diagnosis present

## 2014-08-30 HISTORY — PX: TOTAL KNEE ARTHROPLASTY: SHX125

## 2014-08-30 HISTORY — DX: Polyp of colon: K63.5

## 2014-08-30 HISTORY — DX: Unspecified osteoarthritis, unspecified site: M19.90

## 2014-08-30 LAB — TYPE AND SCREEN
ABO/RH(D): A POS
Antibody Screen: NEGATIVE

## 2014-08-30 LAB — GLUCOSE, CAPILLARY
GLUCOSE-CAPILLARY: 120 mg/dL — AB (ref 70–99)
GLUCOSE-CAPILLARY: 107 mg/dL — AB (ref 70–99)

## 2014-08-30 SURGERY — ARTHROPLASTY, KNEE, TOTAL
Anesthesia: Spinal | Site: Knee | Laterality: Left

## 2014-08-30 MED ORDER — CEFAZOLIN SODIUM-DEXTROSE 2-3 GM-% IV SOLR
2.0000 g | INTRAVENOUS | Status: AC
  Administered 2014-08-30: 2 g via INTRAVENOUS

## 2014-08-30 MED ORDER — BUPIVACAINE HCL (PF) 0.25 % IJ SOLN
INTRAMUSCULAR | Status: AC
  Filled 2014-08-30: qty 30

## 2014-08-30 MED ORDER — PHENYLEPHRINE HCL 10 MG/ML IJ SOLN
INTRAMUSCULAR | Status: DC | PRN
  Administered 2014-08-30: 40 ug via INTRAVENOUS

## 2014-08-30 MED ORDER — DIPHENHYDRAMINE HCL 12.5 MG/5ML PO ELIX
12.5000 mg | ORAL_SOLUTION | ORAL | Status: DC | PRN
Start: 2014-08-30 — End: 2014-09-01

## 2014-08-30 MED ORDER — ONDANSETRON HCL 4 MG/2ML IJ SOLN
INTRAMUSCULAR | Status: AC
  Filled 2014-08-30: qty 2

## 2014-08-30 MED ORDER — SODIUM CHLORIDE 0.9 % IJ SOLN
INTRAMUSCULAR | Status: AC
  Filled 2014-08-30: qty 50

## 2014-08-30 MED ORDER — DEXTROSE 5 % IV SOLN
500.0000 mg | Freq: Four times a day (QID) | INTRAVENOUS | Status: DC | PRN
  Administered 2014-08-30: 500 mg via INTRAVENOUS
  Filled 2014-08-30 (×2): qty 5

## 2014-08-30 MED ORDER — FENTANYL CITRATE (PF) 100 MCG/2ML IJ SOLN
INTRAMUSCULAR | Status: DC | PRN
  Administered 2014-08-30: 50 ug via INTRAVENOUS

## 2014-08-30 MED ORDER — PHENYLEPHRINE 40 MCG/ML (10ML) SYRINGE FOR IV PUSH (FOR BLOOD PRESSURE SUPPORT)
PREFILLED_SYRINGE | INTRAVENOUS | Status: AC
  Filled 2014-08-30: qty 10

## 2014-08-30 MED ORDER — MORPHINE SULFATE 2 MG/ML IJ SOLN
1.0000 mg | INTRAMUSCULAR | Status: DC | PRN
  Administered 2014-08-30: 1 mg via INTRAVENOUS
  Filled 2014-08-30: qty 1

## 2014-08-30 MED ORDER — RIVAROXABAN 10 MG PO TABS
10.0000 mg | ORAL_TABLET | Freq: Every day | ORAL | Status: DC
Start: 2014-08-31 — End: 2014-09-01
  Administered 2014-08-31 – 2014-09-01 (×2): 10 mg via ORAL
  Filled 2014-08-30 (×3): qty 1

## 2014-08-30 MED ORDER — TRAMADOL HCL 50 MG PO TABS: 50.0000 mg | ORAL_TABLET | Freq: Four times a day (QID) | ORAL | Status: DC | PRN

## 2014-08-30 MED ORDER — METOCLOPRAMIDE HCL 5 MG/ML IJ SOLN
5.0000 mg | Freq: Three times a day (TID) | INTRAMUSCULAR | Status: DC | PRN
Start: 2014-08-30 — End: 2014-09-01
  Administered 2014-08-30: 10 mg via INTRAVENOUS
  Filled 2014-08-30: qty 2

## 2014-08-30 MED ORDER — EPHEDRINE SULFATE 50 MG/ML IJ SOLN
INTRAMUSCULAR | Status: DC | PRN
  Administered 2014-08-30 (×2): 5 mg via INTRAVENOUS

## 2014-08-30 MED ORDER — METOCLOPRAMIDE HCL 10 MG PO TABS: 5.0000 mg | ORAL_TABLET | Freq: Three times a day (TID) | ORAL | Status: DC | PRN

## 2014-08-30 MED ORDER — ONDANSETRON HCL 4 MG PO TABS: 4.0000 mg | ORAL_TABLET | Freq: Four times a day (QID) | ORAL | Status: DC | PRN

## 2014-08-30 MED ORDER — PROPOFOL INFUSION 10 MG/ML OPTIME
INTRAVENOUS | Status: DC | PRN
  Administered 2014-08-30: 50 ug/kg/min via INTRAVENOUS

## 2014-08-30 MED ORDER — SODIUM CHLORIDE 0.9 % IV SOLN
INTRAVENOUS | Status: DC
Start: 2014-08-30 — End: 2014-08-30

## 2014-08-30 MED ORDER — MENTHOL 3 MG MT LOZG: 1.0000 | LOZENGE | OROMUCOSAL | Status: DC | PRN

## 2014-08-30 MED ORDER — LIDOCAINE HCL (CARDIAC) 20 MG/ML IV SOLN
INTRAVENOUS | Status: DC | PRN
  Administered 2014-08-30: 100 mg via INTRAVENOUS

## 2014-08-30 MED ORDER — TRANEXAMIC ACID 1000 MG/10ML IV SOLN
1000.0000 mg | INTRAVENOUS | Status: AC
  Administered 2014-08-30: 1000 mg via INTRAVENOUS
  Filled 2014-08-30: qty 10

## 2014-08-30 MED ORDER — PHENYLEPHRINE HCL 10 MG/ML IJ SOLN
INTRAMUSCULAR | Status: AC
  Filled 2014-08-30: qty 2

## 2014-08-30 MED ORDER — LACTATED RINGERS IV SOLN: INTRAVENOUS | Status: DC

## 2014-08-30 MED ORDER — ONDANSETRON HCL 4 MG/2ML IJ SOLN
4.0000 mg | Freq: Four times a day (QID) | INTRAMUSCULAR | Status: DC | PRN
  Administered 2014-08-30: 4 mg via INTRAVENOUS
  Filled 2014-08-30: qty 2

## 2014-08-30 MED ORDER — PHENOL 1.4 % MT LIQD: 1.0000 | OROMUCOSAL | Status: DC | PRN

## 2014-08-30 MED ORDER — PROPOFOL 10 MG/ML IV BOLUS
INTRAVENOUS | Status: AC
  Filled 2014-08-30: qty 20

## 2014-08-30 MED ORDER — HYDROCHLOROTHIAZIDE 12.5 MG PO CAPS
12.5000 mg | ORAL_CAPSULE | Freq: Every morning | ORAL | Status: DC
  Administered 2014-08-31 – 2014-09-01 (×2): 12.5 mg via ORAL
  Filled 2014-08-30 (×2): qty 1

## 2014-08-30 MED ORDER — METHOCARBAMOL 500 MG PO TABS
500.0000 mg | ORAL_TABLET | Freq: Four times a day (QID) | ORAL | Status: DC | PRN
  Administered 2014-08-31 (×3): 500 mg via ORAL
  Filled 2014-08-30 (×4): qty 1

## 2014-08-30 MED ORDER — LIDOCAINE HCL (CARDIAC) 20 MG/ML IV SOLN
INTRAVENOUS | Status: AC
  Filled 2014-08-30: qty 5

## 2014-08-30 MED ORDER — CEFAZOLIN SODIUM-DEXTROSE 2-3 GM-% IV SOLR
2.0000 g | Freq: Four times a day (QID) | INTRAVENOUS | Status: AC
  Administered 2014-08-30 (×2): 2 g via INTRAVENOUS
  Filled 2014-08-30 (×2): qty 50

## 2014-08-30 MED ORDER — SODIUM CHLORIDE 0.9 % IJ SOLN
INTRAMUSCULAR | Status: DC | PRN
  Administered 2014-08-30: 30 mL via INTRAVENOUS

## 2014-08-30 MED ORDER — DEXAMETHASONE SODIUM PHOSPHATE 10 MG/ML IJ SOLN
10.0000 mg | Freq: Once | INTRAMUSCULAR | Status: AC
  Administered 2014-08-31: 10 mg via INTRAVENOUS
  Filled 2014-08-30: qty 1

## 2014-08-30 MED ORDER — OXYCODONE HCL 5 MG PO TABS
5.0000 mg | ORAL_TABLET | ORAL | Status: DC | PRN
  Administered 2014-08-30 – 2014-09-01 (×12): 10 mg via ORAL
  Filled 2014-08-30 (×12): qty 2

## 2014-08-30 MED ORDER — POLYETHYLENE GLYCOL 3350 17 G PO PACK: 17.0000 g | PACK | Freq: Every day | ORAL | Status: DC | PRN

## 2014-08-30 MED ORDER — ROSUVASTATIN CALCIUM 5 MG PO TABS
5.0000 mg | ORAL_TABLET | ORAL | Status: DC
  Administered 2014-08-30 – 2014-09-01 (×2): 5 mg via ORAL
  Filled 2014-08-30 (×2): qty 1

## 2014-08-30 MED ORDER — ACETAMINOPHEN 650 MG RE SUPP: 650.0000 mg | Freq: Four times a day (QID) | RECTAL | Status: DC | PRN

## 2014-08-30 MED ORDER — SODIUM CHLORIDE 0.9 % IV SOLN
INTRAVENOUS | Status: DC
  Administered 2014-08-31: 02:00:00 via INTRAVENOUS

## 2014-08-30 MED ORDER — CEFAZOLIN SODIUM-DEXTROSE 2-3 GM-% IV SOLR
INTRAVENOUS | Status: AC
  Filled 2014-08-30: qty 50

## 2014-08-30 MED ORDER — BISACODYL 10 MG RE SUPP: 10.0000 mg | Freq: Every day | RECTAL | Status: DC | PRN

## 2014-08-30 MED ORDER — BUPIVACAINE IN DEXTROSE 0.75-8.25 % IT SOLN
INTRATHECAL | Status: DC | PRN
  Administered 2014-08-30: 1.4 mL via INTRATHECAL

## 2014-08-30 MED ORDER — CHLORHEXIDINE GLUCONATE 4 % EX LIQD: 60.0000 mL | Freq: Once | CUTANEOUS | Status: DC

## 2014-08-30 MED ORDER — ACETAMINOPHEN 325 MG PO TABS: 650.0000 mg | ORAL_TABLET | Freq: Four times a day (QID) | ORAL | Status: DC | PRN

## 2014-08-30 MED ORDER — ACETAMINOPHEN 10 MG/ML IV SOLN
1000.0000 mg | Freq: Once | INTRAVENOUS | Status: AC
  Administered 2014-08-30: 1000 mg via INTRAVENOUS
  Filled 2014-08-30: qty 100

## 2014-08-30 MED ORDER — KETOROLAC TROMETHAMINE 15 MG/ML IJ SOLN
7.5000 mg | Freq: Four times a day (QID) | INTRAMUSCULAR | Status: AC | PRN
  Administered 2014-08-30: 7.5 mg via INTRAVENOUS
  Filled 2014-08-30: qty 1

## 2014-08-30 MED ORDER — FLEET ENEMA 7-19 GM/118ML RE ENEM: 1.0000 | ENEMA | Freq: Once | RECTAL | Status: AC | PRN

## 2014-08-30 MED ORDER — DOCUSATE SODIUM 100 MG PO CAPS
100.0000 mg | ORAL_CAPSULE | Freq: Two times a day (BID) | ORAL | Status: DC
  Administered 2014-08-30 – 2014-09-01 (×4): 100 mg via ORAL

## 2014-08-30 MED ORDER — PHENYLEPHRINE HCL 10 MG/ML IJ SOLN
20.0000 mg | INTRAVENOUS | Status: DC | PRN
  Administered 2014-08-30: 20 ug/min via INTRAVENOUS

## 2014-08-30 MED ORDER — BUPIVACAINE LIPOSOME 1.3 % IJ SUSP
INTRAMUSCULAR | Status: DC | PRN
  Administered 2014-08-30: 50 mL

## 2014-08-30 MED ORDER — FENTANYL CITRATE (PF) 100 MCG/2ML IJ SOLN
INTRAMUSCULAR | Status: AC
  Filled 2014-08-30: qty 2

## 2014-08-30 MED ORDER — LACTATED RINGERS IV SOLN
INTRAVENOUS | Status: DC | PRN
  Administered 2014-08-30: 09:00:00 via INTRAVENOUS

## 2014-08-30 MED ORDER — FENTANYL CITRATE (PF) 100 MCG/2ML IJ SOLN: 25.0000 ug | INTRAMUSCULAR | Status: DC | PRN

## 2014-08-30 MED ORDER — CARVEDILOL PHOSPHATE ER 40 MG PO CP24
40.0000 mg | ORAL_CAPSULE | Freq: Every morning | ORAL | Status: DC
  Administered 2014-08-31 – 2014-09-01 (×2): 40 mg via ORAL
  Filled 2014-08-30 (×2): qty 1

## 2014-08-30 MED ORDER — BUPIVACAINE LIPOSOME 1.3 % IJ SUSP
20.0000 mL | Freq: Once | INTRAMUSCULAR | Status: DC
  Filled 2014-08-30: qty 20

## 2014-08-30 MED ORDER — MEPERIDINE HCL 50 MG/ML IJ SOLN
6.2500 mg | INTRAMUSCULAR | Status: DC | PRN
Start: 2014-08-30 — End: 2014-08-30

## 2014-08-30 MED ORDER — ONDANSETRON HCL 4 MG/2ML IJ SOLN
INTRAMUSCULAR | Status: DC | PRN
  Administered 2014-08-30: 4 mg via INTRAVENOUS

## 2014-08-30 MED ORDER — MIDAZOLAM HCL 5 MG/5ML IJ SOLN
INTRAMUSCULAR | Status: DC | PRN
  Administered 2014-08-30 (×2): 1 mg via INTRAVENOUS

## 2014-08-30 MED ORDER — PROMETHAZINE HCL 25 MG/ML IJ SOLN: 6.2500 mg | INTRAMUSCULAR | Status: DC | PRN

## 2014-08-30 MED ORDER — MIDAZOLAM HCL 2 MG/2ML IJ SOLN
INTRAMUSCULAR | Status: AC
  Filled 2014-08-30: qty 2

## 2014-08-30 MED ORDER — ACETAMINOPHEN 500 MG PO TABS
1000.0000 mg | ORAL_TABLET | Freq: Four times a day (QID) | ORAL | Status: AC
  Administered 2014-08-30: 1000 mg via ORAL
  Filled 2014-08-30 (×4): qty 2

## 2014-08-30 MED ORDER — DEXAMETHASONE SODIUM PHOSPHATE 10 MG/ML IJ SOLN
10.0000 mg | Freq: Once | INTRAMUSCULAR | Status: AC
  Administered 2014-08-30: 10 mg via INTRAVENOUS

## 2014-08-30 MED ORDER — BUPIVACAINE HCL 0.25 % IJ SOLN
INTRAMUSCULAR | Status: DC | PRN
  Administered 2014-08-30: 30 mL

## 2014-08-30 MED ORDER — DEXAMETHASONE SODIUM PHOSPHATE 10 MG/ML IJ SOLN
INTRAMUSCULAR | Status: AC
  Filled 2014-08-30: qty 1

## 2014-08-30 SURGICAL SUPPLY — 64 items
BAG DECANTER FOR FLEXI CONT (MISCELLANEOUS) ×1 IMPLANT
BAG SPEC THK2 15X12 ZIP CLS (MISCELLANEOUS) ×1
BAG ZIPLOCK 12X15 (MISCELLANEOUS) ×3 IMPLANT
BANDAGE ELASTIC 6 VELCRO ST LF (GAUZE/BANDAGES/DRESSINGS) ×3 IMPLANT
BANDAGE ESMARK 6X9 LF (GAUZE/BANDAGES/DRESSINGS) ×1 IMPLANT
BLADE SAG 18X100X1.27 (BLADE) ×3 IMPLANT
BLADE SAW SGTL 11.0X1.19X90.0M (BLADE) ×3 IMPLANT
BNDG CMPR 9X6 STRL LF SNTH (GAUZE/BANDAGES/DRESSINGS) ×1
BNDG ESMARK 6X9 LF (GAUZE/BANDAGES/DRESSINGS) ×3
BOWL SMART MIX CTS (DISPOSABLE) ×3 IMPLANT
CAP KNEE TOTAL 3 SIGMA ×2 IMPLANT
CEMENT HV SMART SET (Cement) ×4 IMPLANT
CLOSURE WOUND 1/2 X4 (GAUZE/BANDAGES/DRESSINGS) ×1
CUFF TOURN SGL QUICK 34 (TOURNIQUET CUFF) ×3
CUFF TRNQT CYL 34X4X40X1 (TOURNIQUET CUFF) ×1 IMPLANT
DECANTER SPIKE VIAL GLASS SM (MISCELLANEOUS) ×3 IMPLANT
DRAPE EXTREMITY T 121X128X90 (DRAPE) ×3 IMPLANT
DRAPE POUCH INSTRU U-SHP 10X18 (DRAPES) ×3 IMPLANT
DRAPE U-SHAPE 47X51 STRL (DRAPES) ×3 IMPLANT
DRSG ADAPTIC 3X8 NADH LF (GAUZE/BANDAGES/DRESSINGS) ×3 IMPLANT
DRSG PAD ABDOMINAL 8X10 ST (GAUZE/BANDAGES/DRESSINGS) ×3 IMPLANT
DURAPREP 26ML APPLICATOR (WOUND CARE) ×3 IMPLANT
ELECT REM PT RETURN 9FT ADLT (ELECTROSURGICAL) ×3
ELECTRODE REM PT RTRN 9FT ADLT (ELECTROSURGICAL) ×1 IMPLANT
EVACUATOR 1/8 PVC DRAIN (DRAIN) ×3 IMPLANT
FACESHIELD WRAPAROUND (MASK) ×15 IMPLANT
FACESHIELD WRAPAROUND OR TEAM (MASK) ×5 IMPLANT
GAUZE SPONGE 4X4 12PLY STRL (GAUZE/BANDAGES/DRESSINGS) ×3 IMPLANT
GLOVE BIO SURGEON STRL SZ7.5 (GLOVE) IMPLANT
GLOVE BIO SURGEON STRL SZ8 (GLOVE) ×3 IMPLANT
GLOVE BIOGEL PI IND STRL 6.5 (GLOVE) IMPLANT
GLOVE BIOGEL PI IND STRL 8 (GLOVE) ×1 IMPLANT
GLOVE BIOGEL PI INDICATOR 6.5 (GLOVE) ×2
GLOVE BIOGEL PI INDICATOR 8 (GLOVE) ×2
GLOVE SURG SS PI 6.5 STRL IVOR (GLOVE) ×2 IMPLANT
GOWN STRL REUS W/TWL LRG LVL3 (GOWN DISPOSABLE) ×5 IMPLANT
GOWN STRL REUS W/TWL XL LVL3 (GOWN DISPOSABLE) IMPLANT
HANDPIECE INTERPULSE COAX TIP (DISPOSABLE) ×3
IMMOBILIZER KNEE 20 (SOFTGOODS) ×3
IMMOBILIZER KNEE 20 THIGH 36 (SOFTGOODS) ×1 IMPLANT
KIT BASIN OR (CUSTOM PROCEDURE TRAY) ×3 IMPLANT
MANIFOLD NEPTUNE II (INSTRUMENTS) ×3 IMPLANT
NDL SAFETY ECLIPSE 18X1.5 (NEEDLE) ×2 IMPLANT
NEEDLE HYPO 18GX1.5 SHARP (NEEDLE) ×6
NS IRRIG 1000ML POUR BTL (IV SOLUTION) ×3 IMPLANT
PACK TOTAL JOINT (CUSTOM PROCEDURE TRAY) ×3 IMPLANT
PADDING CAST COTTON 6X4 STRL (CAST SUPPLIES) ×5 IMPLANT
PEN SKIN MARKING BROAD (MISCELLANEOUS) ×3 IMPLANT
POSITIONER SURGICAL ARM (MISCELLANEOUS) ×3 IMPLANT
SET HNDPC FAN SPRY TIP SCT (DISPOSABLE) ×1 IMPLANT
STRIP CLOSURE SKIN 1/2X4 (GAUZE/BANDAGES/DRESSINGS) ×3 IMPLANT
SUCTION FRAZIER 12FR DISP (SUCTIONS) ×3 IMPLANT
SUT MNCRL AB 4-0 PS2 18 (SUTURE) ×3 IMPLANT
SUT VIC AB 2-0 CT1 27 (SUTURE) ×12
SUT VIC AB 2-0 CT1 TAPERPNT 27 (SUTURE) ×3 IMPLANT
SUT VLOC 180 0 24IN GS25 (SUTURE) ×3 IMPLANT
SYR 20CC LL (SYRINGE) ×3 IMPLANT
SYR 50ML LL SCALE MARK (SYRINGE) ×3 IMPLANT
TOWEL OR 17X26 10 PK STRL BLUE (TOWEL DISPOSABLE) ×3 IMPLANT
TOWEL OR NON WOVEN STRL DISP B (DISPOSABLE) ×2 IMPLANT
TRAY FOLEY W/METER SILVER 14FR (SET/KITS/TRAYS/PACK) ×3 IMPLANT
WATER STERILE IRR 1500ML POUR (IV SOLUTION) ×3 IMPLANT
WRAP KNEE MAXI GEL POST OP (GAUZE/BANDAGES/DRESSINGS) ×3 IMPLANT
YANKAUER SUCT BULB TIP 10FT TU (MISCELLANEOUS) ×3 IMPLANT

## 2014-08-30 NOTE — Evaluation (Signed)
Physical Therapy Evaluation Patient Details Name: Tamara Anthony MRN: [id number] DOB: 29-Aug-1940 Today's Date: 08/30/2014   History of Present Illness  Pt is a 74 year old female s/p L TKA.  Clinical Impression  Pt is s/p L TKA resulting in the deficits listed below (see PT Problem List).  Pt will benefit from skilled PT to increase their independence and safety with mobility to allow discharge to the venue listed below.  Pt plans to d/c home with assist from spouse.  Pt able to ambulate short distance however limited by dizziness POD #0.          Follow Up Recommendations Home health PT    Equipment Recommendations  Rolling walker with 5" wheels    Recommendations for Other Services       Precautions / Restrictions Precautions Precautions: Knee Required Braces or Orthoses: Knee Immobilizer - Left Knee Immobilizer - Left: Discontinue once straight leg raise with < 10 degree lag Restrictions Other Position/Activity Restrictions: WBAT      Mobility  Bed Mobility Overal bed mobility: Needs Assistance Bed Mobility: Supine to Sit     Supine to sit: Min assist;HOB elevated     General bed mobility comments: verbal cues for technique, assist for L LE  Transfers Overall transfer level: Needs assistance Equipment used: Rolling walker (2 wheeled) Transfers: Sit to/from Stand Sit to Stand: Min assist         General transfer comment: verbal cues for UE and LE positioning, assist to rise and control descent  Ambulation/Gait Ambulation/Gait assistance: Min assist Ambulation Distance (Feet): 30 Feet Assistive device: Rolling walker (2 wheeled) Gait Pattern/deviations: Step-to pattern;Decreased stride length;Trunk flexed     General Gait Details: verbal cues for sequence, RW distance, step length, pt with reported dizziness limiting distance  Stairs            Wheelchair Mobility    Modified Rankin (Stroke Patients Only)       Balance                                             Pertinent Vitals/Pain Pain Assessment: 0-10 Pain Score: 4  Pain Descriptors / Indicators: Aching;Sore Pain Intervention(s): Limited activity within patient's tolerance;Monitored during session;Premedicated before session;Repositioned    Home Living Family/patient expects to be discharged to:: Private residence Living Arrangements: Spouse/significant other   Type of Home: House Home Access: Stairs to enter Entrance Stairs-Rails: None Technical brewer of Steps: 1 Home Layout: One level Home Equipment: None      Prior Function Level of Independence: Independent               Hand Dominance        Extremity/Trunk Assessment               Lower Extremity Assessment: LLE deficits/detail   LLE Deficits / Details: fair quad contraction, unable to perform SLR, maintained KI     Communication   Communication: No difficulties  Cognition Arousal/Alertness: Awake/alert Behavior During Therapy: WFL for tasks assessed/performed Overall Cognitive Status: Within Functional Limits for tasks assessed                      General Comments      Exercises        Assessment/Plan    PT Assessment Patient needs continued PT services  PT Diagnosis Difficulty walking;Acute pain  PT Problem List Decreased strength;Decreased range of motion;Decreased mobility;Decreased knowledge of precautions;Decreased knowledge of use of DME;Pain  PT Treatment Interventions Functional mobility training;Stair training;Gait training;DME instruction;Patient/family education;Therapeutic activities;Therapeutic exercise   PT Goals (Current goals can be found in the Care Plan section) Acute Rehab PT Goals PT Goal Formulation: With patient Time For Goal Achievement: 09/03/14 Potential to Achieve Goals: Good    Frequency 7X/week   Barriers to discharge        Co-evaluation               End of Session Equipment Utilized During  Treatment: Gait belt;Left knee immobilizer Activity Tolerance: Other (comment) (limited by dizziness) Patient left: in chair;with call bell/phone within reach;with family/visitor present Nurse Communication: Mobility status         Time: 1534-1550 PT Time Calculation (min) (ACUTE ONLY): 16 min   Charges:   PT Evaluation $Initial PT Evaluation Tier I: 1 Procedure     PT G Codes:        Tirrell Buchberger,KATHrine E 08/30/2014, 5:10 PM Carmelia Bake, PT, DPT 08/30/2014 Pager: [phone]

## 2014-08-30 NOTE — Anesthesia Procedure Notes (Addendum)
Spinal Patient location during procedure: OR Start time: 08/30/2014 9:08 AM End time: 08/30/2014 9:16 AM Staffing Resident/CRNA: Carleene Cooper A Performed by: resident/CRNA  Preanesthetic Checklist Completed: patient identified, site marked, surgical consent, pre-op evaluation, timeout performed, IV checked, risks and benefits discussed and monitors and equipment checked Spinal Block Patient position: sitting Prep: Betadine Patient monitoring: heart rate, continuous pulse ox and blood pressure Approach: midline Location: L2-3 Injection technique: single-shot Needle Needle type: Sprotte  Needle gauge: 24 G Needle length: 10 cm Assessment Sensory level: T4 Additional Notes Pt tolerated procedure well. + CSF, - heme. Spinal expiration date 01-2016.  Procedure Name: MAC Date/Time: 08/30/2014 9:02 AM Performed by: Carleene Cooper A Pre-anesthesia Checklist: Patient identified and Timeout performed Patient Re-evaluated:Patient Re-evaluated prior to inductionOxygen Delivery Method: Simple face mask Dental Injury: Teeth and Oropharynx as per pre-operative assessment

## 2014-08-30 NOTE — Transfer of Care (Signed)
Immediate Anesthesia Transfer of Care Note  Patient: Tamara Anthony  Procedure(s) Performed: Procedure(s): TOTAL LEFT KNEE ARTHROPLASTY (Left)  Patient Location: PACU  Anesthesia Type:Spinal  Level of Consciousness: awake, alert , oriented and patient cooperative  Airway & Oxygen Therapy: Patient Spontanous Breathing and Patient connected to face mask oxygen  Post-op Assessment: Report given to RN, Post -op Vital signs reviewed and stable and able to wiggle toes for SAB level.  Post vital signs: Reviewed and stable  Last Vitals:  Filed Vitals:   08/30/14 0735  BP: 175/82  Pulse: 87  Temp: 36.8 C  Resp: 20    Complications: No apparent anesthesia complications

## 2014-08-30 NOTE — Anesthesia Postprocedure Evaluation (Signed)
  Anesthesia Post-op Note  Patient: Tamara Anthony  Procedure(s) Performed: Procedure(s) (LRB): TOTAL LEFT KNEE ARTHROPLASTY (Left)  Patient Location: PACU  Anesthesia Type: Spinal  Level of Consciousness: awake and alert   Airway and Oxygen Therapy: Patient Spontanous Breathing  Post-op Pain: mild  Post-op Assessment: Post-op Vital signs reviewed, Patient's Cardiovascular Status Stable, Respiratory Function Stable, Patent Airway and No signs of Nausea or vomiting  Last Vitals:  Filed Vitals:   08/30/14 1231  BP: 146/66  Pulse: 76  Temp: 36.3 C  Resp: 14    Post-op Vital Signs: stable   Complications: No apparent anesthesia complications

## 2014-08-30 NOTE — Anesthesia Preprocedure Evaluation (Addendum)
Anesthesia Evaluation  Patient identified by MRN, date of birth, ID band Patient awake    Reviewed: Allergy & Precautions, NPO status , Patient's Chart, lab work & pertinent test results  Airway Mallampati: II  TM Distance: >3 FB Neck ROM: Full    Dental no notable dental hx. (+) Lower Dentures, Partial Upper   Pulmonary former smoker,  breath sounds clear to auscultation  Pulmonary exam normal       Cardiovascular hypertension, Pt. on medications Rhythm:Regular Rate:Normal     Neuro/Psych negative neurological ROS  negative psych ROS   GI/Hepatic negative GI ROS, Neg liver ROS,   Endo/Other  diabetes, Type 2  Renal/GU negative Renal ROS  negative genitourinary   Musculoskeletal negative musculoskeletal ROS (+)   Abdominal   Peds negative pediatric ROS (+)  Hematology negative hematology ROS (+)   Anesthesia Other Findings   Reproductive/Obstetrics negative OB ROS                          Anesthesia Physical Anesthesia Plan  ASA: III  Anesthesia Plan: Spinal   Post-op Pain Management:    Induction:   Airway Management Planned: Simple Face Mask  Additional Equipment:   Intra-op Plan:   Post-operative Plan:   Informed Consent: I have reviewed the patients History and Physical, chart, labs and discussed the procedure including the risks, benefits and alternatives for the proposed anesthesia with the patient or authorized representative who has indicated his/her understanding and acceptance.   Dental advisory given  Plan Discussed with: CRNA  Anesthesia Plan Comments:         Anesthesia Quick Evaluation                                  Anesthesia Evaluation  Patient identified by MRN, date of birth, ID band Patient awake    Reviewed: Allergy & Precautions, H&P , NPO status , Patient's Chart, lab work & pertinent test results, reviewed documented beta blocker date and  time   Airway Mallampati: II TM Distance: >3 FB Neck ROM: Full    Dental  (+) Lower Dentures and Partial Upper   Pulmonary neg pulmonary ROS,  breath sounds clear to auscultation  Pulmonary exam normal       Cardiovascular hypertension, Pt. on medications and Pt. on home beta blockers Rhythm:Regular Rate:Normal     Neuro/Psych  Neuromuscular disease negative psych ROS   GI/Hepatic negative GI ROS, Neg liver ROS,   Endo/Other  diabetes, Well Controlled, Type 2, Oral Hypoglycemic Agents  Renal/GU negative Renal ROS   Cystocele Rectocele Uterine Prolapse    Musculoskeletal negative musculoskeletal ROS (+)   Abdominal   Peds  Hematology negative hematology ROS (+)   Anesthesia Other Findings   Reproductive/Obstetrics negative OB ROS                         Anesthesia Physical Anesthesia Plan  ASA: II  Anesthesia Plan: Spinal and Combined Spinal and Epidural   Post-op Pain Management:    Induction: Intravenous  Airway Management Planned: Oral ETT  Additional Equipment:   Intra-op Plan:   Post-operative Plan:   Informed Consent: I have reviewed the patients History and Physical, chart, labs and discussed the procedure including the risks, benefits and alternatives for the proposed anesthesia with the patient or authorized representative who has indicated his/her understanding and acceptance.  Dental advisory given  Plan Discussed with: Anesthesiologist, CRNA and Surgeon  Anesthesia Plan Comments:        Anesthesia Quick Evaluation

## 2014-08-30 NOTE — Interval H&P Note (Signed)
History and Physical Interval Note:  08/30/2014 8:16 AM  Tamara Anthony  has presented today for surgery, with the diagnosis of OA LEFT KNEE   The various methods of treatment have been discussed with the patient and family. After consideration of risks, benefits and other options for treatment, the patient has consented to  Procedure(s): TOTAL LEFT KNEE ARTHROPLASTY (Left) as a surgical intervention .  The patient's history has been reviewed, patient examined, no change in status, stable for surgery.  I have reviewed the patient's chart and labs.  Questions were answered to the patient's satisfaction.     [name]

## 2014-08-30 NOTE — Op Note (Signed)
Pre-operative diagnosis- Osteoarthritis  Left knee(s)  Post-operative diagnosis- Osteoarthritis Left knee(s)  Procedure-  Left  Total Knee Arthroplasty  Surgeon- Dione Plover. Vada Yellen, MD  Assistant- Ardeen Jourdain, PA-C   Anesthesia-  Spinal  EBL-* No blood loss amount entered *   Drains Hemovac  Tourniquet time-  Total Tourniquet Time Documented: Thigh (Left) - 35 minutes Total: Thigh (Left) - 35 minutes     Complications- None  Condition-PACU - hemodynamically stable.   Brief Clinical Note  Tamara Anthony is a 74 y.o. 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 She has constant pain, with activity and at rest and significant functional deficits with difficulties even with ADLs. She has had extensive non-op management including analgesics, injections of cortisone and viscosupplements, and home exercise program, but remains in significant pain with significant dysfunction. Radiographs show bone on bone arthritis. She presents now for left Total Knee Arthroplasty.    Procedure in detail---   The patient is brought into the operating room and positioned supine on the operating table. After successful administration of  Spinal,   a tourniquet is placed high on the  Left thigh(s) and the lower extremity is prepped and draped in the usual sterile fashion. Time out is performed by the operating team and then the  Left lower extremity is wrapped in Esmarch, knee flexed and the tourniquet inflated to 300 mmHg.       A midline incision is made with a ten blade through the subcutaneous tissue to the level of the extensor mechanism. A fresh blade is used to make a medial parapatellar arthrotomy. Soft tissue over the proximal medial tibia is subperiosteally elevated to the joint line with a knife and into the semimembranosus bursa with a Cobb elevator. Soft tissue over the proximal lateral tibia is elevated with attention being paid to avoiding the  patellar tendon on the tibial tubercle. The patella is everted, knee flexed 90 degrees and the ACL and PCL are removed. Findings are bone on bone all 3 compartments with large global osteophytes.        The drill is used to create a starting hole in the distal femur and the canal is thoroughly irrigated with sterile saline to remove the fatty contents. The 5 degree Left  valgus alignment guide is placed into the femoral canal and the distal femoral cutting block is pinned to remove 10 mm off the distal femur. Resection is made with an oscillating saw.      The tibia is subluxed forward and the menisci are removed. The extramedullary alignment guide is placed referencing proximally at the medial aspect of the tibial tubercle and distally along the second metatarsal axis and tibial crest. The block is pinned to remove 70mm off the more deficient lateral  side. Resection is made with an oscillating saw. Size 3is the most appropriate size for the tibia and the proximal tibia is prepared with the modular drill and keel punch for that size.      The femoral sizing guide is placed and size 3 is most appropriate. Rotation is marked off the epicondylar axis and confirmed by creating a rectangular flexion gap at 90 degrees. The size 3 cutting block is pinned in this rotation and the anterior, posterior and chamfer cuts are made with the oscillating saw. The intercondylar block is then placed and that cut is made.      Trial size 3 tibial component, trial size 3 posterior  stabilized femur and a 10  mm posterior stabilized rotating platform insert trial is placed. Full extension is achieved with excellent varus/valgus and anterior/posterior balance throughout full range of motion. The patella is everted and thickness measured to be 24  mm. Free hand resection is taken to 14 mm, a 35 template is placed, lug holes are drilled, trial patella is placed, and it tracks normally. Osteophytes are removed off the posterior femur with  the trial in place. All trials are removed and the cut bone surfaces prepared with pulsatile lavage. Cement is mixed and once ready for implantation, the size 3 tibial implant, size  3 posterior stabilized femoral component, and the size 35 patella are cemented in place and the patella is held with the clamp. The trial insert is placed and the knee held in full extension. The Exparel (20 ml mixed with 30 ml saline) and .25% Bupivicaine, are injected into the extensor mechanism, posterior capsule, medial and lateral gutters and subcutaneous tissues.  All extruded cement is removed and once the cement is hard the permanent 10 mm posterior stabilized rotating platform insert is placed into the tibial tray.      The wound is copiously irrigated with saline solution and the extensor mechanism closed over a hemovac drain with #1 V-loc suture. The tourniquet is released for a total tourniquet time of 35  minutes. Flexion against gravity is 140 degrees and the patella tracks normally. Subcutaneous tissue is closed with 2.0 vicryl and subcuticular with running 4.0 Monocryl. The incision is cleaned and dried and steri-strips and a bulky sterile dressing are applied. The limb is placed into a knee immobilizer and the patient is awakened and transported to recovery in stable condition.      Please note that a surgical assistant was a medical necessity for this procedure in order to perform it in a safe and expeditious manner. Surgical assistant was necessary to retract the ligaments and vital neurovascular structures to prevent injury to them and also necessary for proper positioning of the limb to allow for anatomic placement of the prosthesis.   Dione Plover [name], MD    08/30/2014, 10:14 AM

## 2014-08-30 NOTE — Progress Notes (Signed)
CARE MANAGEMENT NOTE 08/30/2014  Patient:  Tamara Anthony,Tamara Anthony   Account Number:  [id number]  Date Initiated:  08/30/2014  Documentation initiated by:  DAVIS,RHONDA  Subjective/Objective Assessment:   left total knee replacement     Action/Plan:   home when stable   Anticipated DC Date:  09/02/2014   Anticipated DC Plan:  [place] referral  NA      DC Planning Services  CM consult      [place] Choice  NA   Choice offered to / List presented to:  C-1 Patient           Status of service:  In process, will continue to follow Medicare Important Message given?   (If response is "NO", the following Medicare IM given date fields will be blank) Date Medicare IM given:   Medicare IM given by:   Date Additional Medicare IM given:   Additional Medicare IM given by:    Discharge Disposition:    Per UR Regulation:  Reviewed for med. necessity/level of care/duration of stay  If discussed at [place] of Stay Meetings, dates discussed:    Comments:  [id number] Eldridge Dace, [place], [place] Chart Reviewed. Discharge needs at time of review: None present will follow for needs.

## 2014-08-31 LAB — CBC
HCT: 37.4 % (ref 36.0–46.0)
HEMOGLOBIN: 12.2 g/dL (ref 12.0–15.0)
MCH: 29 pg (ref 26.0–34.0)
MCHC: 32.6 g/dL (ref 30.0–36.0)
MCV: 89 fL (ref 78.0–100.0)
Platelets: 230 10*3/uL (ref 150–400)
RBC: 4.2 MIL/uL (ref 3.87–5.11)
RDW: 13 % (ref 11.5–15.5)
WBC: 11.4 10*3/uL — ABNORMAL HIGH (ref 4.0–10.5)

## 2014-08-31 LAB — BASIC METABOLIC PANEL
Anion gap: 6 (ref 5–15)
BUN: 16 mg/dL (ref 6–23)
CHLORIDE: 108 mmol/L (ref 96–112)
CO2: 26 mmol/L (ref 19–32)
Calcium: 8.7 mg/dL (ref 8.4–10.5)
Creatinine, Ser: 0.69 mg/dL (ref 0.50–1.10)
GFR calc non Af Amer: 85 mL/min — ABNORMAL LOW (ref >90)
GLUCOSE: 117 mg/dL — AB (ref 70–99)
POTASSIUM: 4.2 mmol/L (ref 3.5–5.1)
Sodium: 140 mmol/L (ref 135–145)

## 2014-08-31 MED ORDER — METHOCARBAMOL 500 MG PO TABS: 500.0000 mg | ORAL_TABLET | Freq: Four times a day (QID) | ORAL | Status: DC | PRN

## 2014-08-31 MED ORDER — TRAMADOL HCL 50 MG PO TABS: 50.0000 mg | ORAL_TABLET | Freq: Four times a day (QID) | ORAL | Status: DC | PRN

## 2014-08-31 MED ORDER — RIVAROXABAN 10 MG PO TABS: 10.0000 mg | ORAL_TABLET | Freq: Every day | ORAL | Status: DC

## 2014-08-31 MED ORDER — OXYCODONE HCL 5 MG PO TABS: 5.0000 mg | ORAL_TABLET | ORAL | Status: DC | PRN

## 2014-08-31 NOTE — Discharge Instructions (Addendum)
Dr. Gaynelle Arabian Total Joint Specialist [facility] [address] [place], [place] [address] [phone]  TOTAL KNEE REPLACEMENT POSTOPERATIVE DIRECTIONS  Knee Rehabilitation, Guidelines Following Surgery  Results after knee surgery are often greatly improved when you follow the exercise, range of motion and muscle strengthening exercises prescribed by your doctor. Safety measures are also important to protect the knee from further injury. Any time any of these exercises cause you to have increased pain or swelling in your knee joint, decrease the amount until you are comfortable again and slowly increase them. If you have problems or questions, call your caregiver or physical therapist for advice.   HOME CARE INSTRUCTIONS  Remove items at home which could result in a fall. This includes throw rugs or furniture in walking pathways.   ICE to the affected knee every three hours for 30 minutes at a time and then as needed for pain and swelling.  Continue to use ice on the knee for pain and swelling from surgery. You may notice swelling that will progress down to the foot and ankle.  This is normal after surgery.  Elevate the leg when you are not up walking on it.    Continue to use the breathing machine which will help keep your temperature down.  It is common for your temperature to cycle up and down following surgery, especially at night when you are not up moving around and exerting yourself.  The breathing machine keeps your lungs expanded and your temperature down.  Do not place pillow under knee, focus on keeping the knee straight while resting  DIET You may resume your previous home diet once your are discharged from the hospital.  DRESSING / WOUND CARE / SHOWERING You may change your dressing 3-5 days after surgery.  Then change the dressing every day with sterile gauze.  Please use good hand washing techniques before changing the dressing.  Do not use any  lotions or creams on the incision until instructed by your surgeon. You may start showering once you are discharged home but do not submerge the incision under water. Just pat the incision dry and apply a dry gauze dressing on daily. Change the surgical dressing daily and reapply a dry dressing each time.  ACTIVITY Walk with your walker as instructed. Use walker as long as suggested by your caregivers. Avoid periods of inactivity such as sitting longer than an hour when not asleep. This helps prevent blood clots.  You may resume a sexual relationship in one month or when given the OK by your doctor.  You may return to work once you are cleared by your doctor.  Do not drive a car for 6 weeks or until released by you surgeon.  Do not drive while taking narcotics.  WEIGHT BEARING Weight bearing as tolerated with assist device (walker, cane, etc) as directed, use it as long as suggested by your surgeon or therapist, typically at least 4-6 weeks.  POSTOPERATIVE CONSTIPATION PROTOCOL Constipation - defined medically as fewer than three stools per week and severe constipation as less than one stool per week.  One of the most common issues patients have following surgery is constipation.  Even if you have a regular bowel pattern at home, your normal regimen is likely to be disrupted due to multiple reasons following surgery.  Combination of anesthesia, postoperative narcotics, change in appetite and fluid intake all can affect your bowels.  In order to avoid complications following surgery, here are some recommendations  in order to help you during your recovery period.  Colace (docusate) - Pick up an over-the-counter form of Colace or another stool softener and take twice a day as long as you are requiring postoperative pain medications.  Take with a full glass of water daily.  If you experience loose stools or diarrhea, hold the colace until you stool forms back up.  If your symptoms do not get better  within 1 week or if they get worse, check with your doctor.  Dulcolax (bisacodyl) - Pick up over-the-counter and take as directed by the product packaging as needed to assist with the movement of your bowels.  Take with a full glass of water.  Use this product as needed if not relieved by Colace only.   MiraLax (polyethylene glycol) - Pick up over-the-counter to have on hand.  MiraLax is a solution that will increase the amount of water in your bowels to assist with bowel movements.  Take as directed and can mix with a glass of water, juice, soda, coffee, or tea.  Take if you go more than two days without a movement. Do not use MiraLax more than once per day. Call your doctor if you are still constipated or irregular after using this medication for 7 days in a row.  If you continue to have problems with postoperative constipation, please contact the office for further assistance and recommendations.  If you experience "the worst abdominal pain ever" or develop nausea or vomiting, please contact the office immediatly for further recommendations for treatment.  ITCHING  If you experience itching with your medications, try taking only a single pain pill, or even half a pain pill at a time.  You can also use Benadryl over the counter for itching or also to help with sleep.   TED HOSE STOCKINGS Wear the elastic stockings on both legs for three weeks following surgery during the day but you may remove then at night for sleeping.  MEDICATIONS See your medication summary on the After Visit Summary that the nursing staff will review with you prior to discharge.  You may have some home medications which will be placed on hold until you complete the course of blood thinner medication.  It is important for you to complete the blood thinner medication as prescribed by your surgeon.  Continue your approved medications as instructed at time of discharge.  PRECAUTIONS If you experience chest pain or shortness  of breath - call 911 immediately for transfer to the hospital emergency department.  If you develop a fever greater that 101 F, purulent drainage from wound, increased redness or drainage from wound, foul odor from the wound/dressing, or calf pain - CONTACT YOUR SURGEON.                                                   FOLLOW-UP APPOINTMENTS Make sure you keep all of your appointments after your operation with your surgeon and caregivers. You should call the office at the above phone number and make an appointment for approximately two weeks after the date of your surgery or on the date instructed by your surgeon outlined in the "After Visit Summary".   RANGE OF MOTION AND STRENGTHENING EXERCISES  Rehabilitation of the knee is important following a knee injury or an operation. After just a few days of immobilization, the muscles  of the thigh which control the knee become weakened and shrink (atrophy). Knee exercises are designed to build up the tone and strength of the thigh muscles and to improve knee motion. Often times heat used for twenty to thirty minutes before working out will loosen up your tissues and help with improving the range of motion but do not use heat for the first two weeks following surgery. These exercises can be done on a training (exercise) mat, on the floor, on a table or on a bed. Use what ever works the best and is most comfortable for you Knee exercises include:  Leg Lifts - While your knee is still immobilized in a splint or cast, you can do straight leg raises. Lift the leg to 60 degrees, hold for 3 sec, and slowly lower the leg. Repeat 10-20 times 2-3 times daily. Perform this exercise against resistance later as your knee gets better.  Quad and Hamstring Sets - Tighten up the muscle on the front of the thigh (Quad) and hold for 5-10 sec. Repeat this 10-20 times hourly. Hamstring sets are done by pushing the foot backward against an object and holding for 5-10 sec. Repeat as  with quad sets.   Leg Slides: Lying on your back, slowly slide your foot toward your buttocks, bending your knee up off the floor (only go as far as is comfortable). Then slowly slide your foot back down until your leg is flat on the floor again.  Angel Wings: Lying on your back spread your legs to the side as far apart as you can without causing discomfort.  A rehabilitation program following serious knee injuries can speed recovery and prevent re-injury in the future due to weakened muscles. Contact your doctor or a physical therapist for more information on knee rehabilitation.   IF YOU ARE TRANSFERRED TO A SKILLED REHAB FACILITY If the patient is transferred to a skilled rehab facility following release from the hospital, a list of the current medications will be sent to the facility for the patient to continue.  When discharged from the skilled rehab facility, please have the facility set up the patient's [place] prior to being released. Also, the skilled facility will be responsible for providing the patient with their medications at time of release from the facility to include their pain medication, the muscle relaxants, and their blood thinner medication. If the patient is still at the rehab facility at time of the two week follow up appointment, the skilled rehab facility will also need to assist the patient in arranging follow up appointment in our office and any transportation needs.  MAKE SURE YOU:  Understand these instructions.  Get help right away if you are not doing well or get worse.    Pick up stool softner and laxative for home use following surgery while on pain medications. Do not submerge incision under water. Please use good hand washing techniques while changing dressing each day. May shower starting three days after surgery. Please use a clean towel to pat the incision dry following showers. Continue to use ice for pain and swelling after  surgery. Do not use any lotions or creams on the incision until instructed by your surgeon.  Take Xarelto for two and a half more weeks, then discontinue Xarelto. Once the patient has completed the Xarelto, they may resume the 81 mg Aspirin.   Information on my medicine - XARELTO (Rivaroxaban)  This medication education was reviewed with me or my healthcare representative  as part of my discharge preparation.   Why was Xarelto prescribed for you? Xarelto was prescribed for you to reduce the risk of blood clots forming after orthopedic surgery. The medical term for these abnormal blood clots is venous thromboembolism (VTE).  What do you need to know about xarelto ? Take your Xarelto ONCE DAILY at the same time every day. You may take it either with or without food.  If you have difficulty swallowing the tablet whole, you may crush it and mix in applesauce just prior to taking your dose.  Take Xarelto exactly as prescribed by your doctor and DO NOT stop taking Xarelto without talking to the doctor who prescribed the medication.  Stopping without other VTE prevention medication to take the place of Xarelto may increase your risk of developing a clot.  After discharge, you should have regular check-up appointments with your healthcare provider that is prescribing your Xarelto.    What do you do if you miss a dose? If you miss a dose, take it as soon as you remember on the same day then continue your regularly scheduled once daily regimen the next day. Do not take two doses of Xarelto on the same day.   Important Safety Information A possible side effect of Xarelto is bleeding. You should call your healthcare provider right away if you experience any of the following: ? Bleeding from an injury or your nose that does not stop. ? Unusual colored urine (red or dark brown) or unusual colored stools (red or black). ? Unusual bruising for unknown reasons. ? A serious fall or if you hit  your head (even if there is no bleeding).  Some medicines may interact with Xarelto and might increase your risk of bleeding while on Xarelto. To help avoid this, consult your healthcare provider or pharmacist prior to using any new prescription or non-prescription medications, including herbals, vitamins, non-steroidal anti-inflammatory drugs (NSAIDs) and supplements.  This website has more information on Xarelto: [email or web address]

## 2014-08-31 NOTE — Progress Notes (Signed)
CARE MANAGEMENT NOTE 08/31/2014  Patient:  Tamara Anthony,Tamara Anthony   Account Number:  [id number]  Date Initiated:  08/30/2014  Documentation initiated by:  DAVIS,RHONDA  Subjective/Objective Assessment:   left total knee replacement     Action/Plan:   home when stable   Anticipated DC Date:  09/02/2014   Anticipated DC Plan:  [place] referral  NA      DC Planning Services  CM consult      [facility] Choice  NA   Choice offered to / List presented to:  C-1 Patient        [place] arranged  HH-2 PT      [place]   Status of service:  In process, will continue to follow Medicare Important Message given?   (If response is "NO", the following Medicare IM given date fields will be blank) Date Medicare IM given:   Medicare IM given by:   Date Additional Medicare IM given:   Additional Medicare IM given by:    Discharge Disposition:    Per UR Regulation:  Reviewed for med. necessity/level of care/duration of stay  If discussed at [place] of Stay Meetings, dates discussed:    Comments:  08/31/14 Edwyna Shell RN BSN CM [phone] Patient stated she was aware that [facility] Go will be providing [facility] services. She stated that she would also like a rolling walker prior to discharge. Confirmed patient address and contact information.  [place], RN, BSN, [place] [phone] Chart Reviewed. Discharge needs at time of review: None present will follow for needs.

## 2014-08-31 NOTE — Progress Notes (Signed)
Physical Therapy Treatment Patient Details Name: Tamara Anthony MRN: [id number] DOB: 1941/04/30 Today's Date: 08/31/2014    History of Present Illness Pt is a 74 year old female s/p L TKA.    PT Comments    POD #1 AM Session; pt was OOB in chair; Sit to stand with walker; ambulate 120 ft in hall; pt no c/o pain increase during ambulating; VC reminder to pt to not pick up walker when ambulating; returned to room and to chair for exercises; notified RN that blood drainage present on wrapping (reinforced with tape) when immobilizer removed; provided pt copy of exercises and education; completed exercises; pt stated pain increased to 5/10 after exercises; placed ice on L KNEE and left pt in chair.   Follow Up Recommendations  Home health PT     Equipment Recommendations  Rolling walker with 5" wheels    Recommendations for Other Services       Precautions / Restrictions Precautions Precautions: Knee Required Braces or Orthoses: Knee Immobilizer - Left Restrictions Other Position/Activity Restrictions: WBAT    Mobility  Bed Mobility                  Transfers Overall transfer level: Needs assistance Equipment used: Rolling walker (2 wheeled) Transfers: Sit to/from Stand Sit to Stand: Min assist         General transfer comment: Verbal Cues for hand placement  Ambulation/Gait Ambulation/Gait assistance: Min assist Ambulation Distance (Feet): 120 Feet Assistive device: Rolling walker (2 wheeled) Gait Pattern/deviations: Step-to pattern;Decreased stride length;Trunk flexed Gait velocity: decreased    General Gait Details: Verbal cue remainder to not pick RW keep it grounded;    Financial trader Rankin (Stroke Patients Only)       Balance                                    Cognition Arousal/Alertness: Awake/alert Behavior During Therapy: WFL for tasks assessed/performed Overall Cognitive Status: Within  Functional Limits for tasks assessed                      Exercises Total Joint Exercises Ankle Circles/Pumps: AROM;Both;20 reps;Seated Quad Sets: AROM;Left;10 reps;Seated Towel Squeeze: AROM;Seated;Both;10 reps Heel Slides: Left;10 reps;Seated;AAROM Hip ABduction/ADduction: AROM;Left;10 reps;Seated Straight Leg Raises: AAROM;Seated;Left;10 reps    General Comments        Pertinent Vitals/Pain Pain Assessment: 0-10 Pain Score: 5  (3/10 pain in chair 5/10 post exercise and ambulating) Pain Location: L Knee Pain Intervention(s): Limited activity within patient's tolerance;Monitored during session;Ice applied;Repositioned    Home Living                      Prior Function            PT Goals (current goals can now be found in the care plan section) Progress towards PT goals: Progressing toward goals    Frequency  7X/week    PT Plan      Co-evaluation             End of Session Equipment Utilized During Treatment: Gait belt;Left knee immobilizer Activity Tolerance: Patient tolerated treatment well Patient left: in chair;with call bell/phone within reach;with family/visitor present     Time:  -1000-1035    Charges:    1 gt 1 te  G Codes:      [id number],XTAVW Student PTA  08/31/2014, 1:15 PM   Reviewed and agree with above Rica Koyanagi  PTA [facility]  Acute  Rehab Pager      [phone]

## 2014-08-31 NOTE — Progress Notes (Signed)
Physical Therapy Treatment Patient Details Name: Tamara Anthony MRN: [id number] DOB: 06-14-1940 Today's Date: 08/31/2014    History of Present Illness Pt is a 74 year old female s/p L TKA.    PT Comments    POD #1 PM Session; Pt was in bed with husband present in room; applied L knee immobilizer; pt OOB to EOB; no c/o of pain, dizziness, or nausea per pt; pt stated she received pain meds at 1430; Pt sit to stand to walker to ambulate; ambulate 100 ft in unit hall; returned to room into bed; removed immobilizer (bandage with drainage still present no increased drainage noted); pt c/o 3/10 pain after ambulating; informed [employer] pt finished and ready for CPM machine;   Follow Up Recommendations  Home health PT     Equipment Recommendations  Rolling walker with 5" wheels    Recommendations for Other Services       Precautions / Restrictions Precautions Precautions: Knee Required Braces or Orthoses: Knee Immobilizer - Left Restrictions Other Position/Activity Restrictions: WBAT    Mobility  Bed Mobility Overal bed mobility: Needs Assistance Bed Mobility: Supine to Sit     Supine to sit: Min assist;HOB elevated     General bed mobility comments: Assist with L LE   Transfers Overall transfer level: Needs assistance Equipment used: Rolling walker (2 wheeled) Transfers: Sit to/from Stand Sit to Stand: Min assist         General transfer comment: Verbal Cues for hand placement  Ambulation/Gait Ambulation/Gait assistance: Min assist Ambulation Distance (Feet): 100 Feet Assistive device: Rolling walker (2 wheeled) Gait Pattern/deviations: Step-to pattern;Decreased stride length;Trunk flexed Gait velocity: decreased    General Gait Details: Pt cue 1 or 2 times for staying closer to walker; stand more up right   Stairs            Wheelchair Mobility    Modified Rankin (Stroke Patients Only)       Balance                                     Cognition Arousal/Alertness: Awake/alert Behavior During Therapy: WFL for tasks assessed/performed Overall Cognitive Status: Within Functional Limits for tasks assessed                      Exercises Total Joint Exercises Ankle Circles/Pumps: AROM;Both;20 reps;Seated Quad Sets: AROM;Left;10 reps;Seated Towel Squeeze: AROM;Seated;Both;10 reps Heel Slides: Left;10 reps;Seated;AAROM Hip ABduction/ADduction: AROM;Left;10 reps;Seated Straight Leg Raises: AAROM;Seated;Left;10 reps    General Comments        Pertinent Vitals/Pain Pain Assessment: 0-10 Pain Score: 3  (3/10 pain after ambulating ) Pain Location: L Knee Pain Descriptors / Indicators: Sore Pain Intervention(s): Monitored during session;Limited activity within patient's tolerance;Premedicated before session    Home Living                      Prior Function            PT Goals (current goals can now be found in the care plan section) Progress towards PT goals: Progressing toward goals    Frequency  7X/week    PT Plan      Co-evaluation             End of Session Equipment Utilized During Treatment: Gait belt;Left knee immobilizer Activity Tolerance: Patient tolerated treatment well Patient left: in bed;with family/visitor present;with call bell/phone within reach  Time: 15:10 - 15;26 PT Time Calculation (min) (ACUTE ONLY): 35 min  Charges:  $Gait Training: 8-22 mins                    G Codes:      Duric,[place]re[place] Student PTA 08/31/2014, 3:39 PM   Reviewed and agree with above Rica Koyanagi  PTA [facility]  Acute  Rehab Pager      [phone]

## 2014-08-31 NOTE — Progress Notes (Signed)
   Subjective: 1 Day Post-Op Procedure(s) (LRB): TOTAL LEFT KNEE ARTHROPLASTY (Left) Patient reports pain as mild.   Patient seen in rounds with Dr. Wynelle Link. Sitting up in bed. Patient is well, but has had some minor complaints of pain in the knee, requiring pain medications We will start therapy today.  Plan is to go Home after hospital stay. Possibly home tomorrow.  Objective: Vital signs in last 24 hours: Temp:  [97.2 F (36.2 C)-98.3 F (36.8 C)] 98 F (36.7 C) (04/19 0522) Pulse Rate:  [64-78] 76 (04/19 0522) Resp:  [12-16] 16 (04/19 0522) BP: (120-146)/(50-74) 128/57 mmHg (04/19 0522) SpO2:  [99 %-100 %] 100 % (04/19 0522)  Intake/Output from previous day:  Intake/Output Summary (Last 24 hours) at 08/31/14 0857 Last data filed at 08/31/14 0947  Gross per 24 hour  Intake   4730 ml  Output   2080 ml  Net   2650 ml    Intake/Output this shift: Total I/O In: 240 [P.O.:240] Out: -   Labs:  Recent Labs  08/31/14 0428  HGB 12.2    Recent Labs  08/31/14 0428  WBC 11.4*  RBC 4.20  HCT 37.4  PLT 230    Recent Labs  08/31/14 0428  NA 140  K 4.2  CL 108  CO2 26  BUN 16  CREATININE 0.69  GLUCOSE 117*  CALCIUM 8.7   No results for input(s): LABPT, INR in the last 72 hours.  EXAM General - Patient is Alert, Appropriate and Oriented Extremity - Neurovascular intact Sensation intact distally Dorsiflexion/Plantar flexion intact Dressing - dressing C/D/I Motor Function - intact, moving foot and toes well on exam.  Hemovac pulled without difficulty.  Past Medical History  Diagnosis Date  . Hypertension   . Hyperlipidemia   . S/P vaginal hysterectomy     03/2012  . Diabetes mellitus     type 2  . Colon polyps   . Arthritis     Assessment/Plan: 1 Day Post-Op Procedure(s) (LRB): TOTAL LEFT KNEE ARTHROPLASTY (Left) Principal Problem:   OA (osteoarthritis) of knee  Estimated body mass index is 30.55 kg/(m^2) as calculated from the following:   Height as of this encounter: 5\' 5"  (1.651 m).   Weight as of this encounter: 83.28 kg (183 lb 9.6 oz). Advance diet Up with therapy Plan for discharge tomorrow Discharge home with home health  DVT Prophylaxis - Xarelto Weight-Bearing as tolerated to left leg D/C O2 and Pulse OX and try on Room Air  Comments:  Needs a RW.  Scheduled to begin outpatient therapy at [facility] on 09/10/2014.  Arlee Muslim, PA-C Orthopaedic Surgery 08/31/2014, 8:57 AM

## 2014-08-31 NOTE — Evaluation (Signed)
Occupational Therapy Evaluation Patient Details Name: Tamara Anthony MRN: [id number] DOB: February 21, 1941 Today's Date: 08/31/2014    History of Present Illness Pt is a 74 year old female s/p L TKA.   Clinical Impression   Pt was admitted for the above. All education was completed.  No further OT is needed at this time    Follow Up Recommendations  No OT follow up;Supervision/Assistance - 24 hour    Equipment Recommendations  None recommended by OT (pt wants to try higher commode at home without DME)    Recommendations for Other Services       Precautions / Restrictions Precautions Precautions: Knee Required Braces or Orthoses: Knee Immobilizer - Left Knee Immobilizer - Left: Discontinue once straight leg raise with < 10 degree lag Restrictions Other Position/Activity Restrictions: WBAT      Mobility Bed Mobility   Bed Mobility: Supine to Sit     Supine to sit: Min assist;HOB elevated     General bed mobility comments: verbal cues for technique, assist for L LE  Transfers   Equipment used: Rolling walker (2 wheeled) Transfers: Sit to/from Stand Sit to Stand: Min assist         General transfer comment: vcs for UE placement    Balance                                            ADL Overall ADL's : Needs assistance/impaired                         Toilet Transfer: Minimal assistance;Ambulation;BSC       Tub/ Shower Transfer: Minimal assistance;Walk-in shower;Ambulation     General ADL Comments: Pt did not want to perform ADL this morning:  she states that husband will assist as needed.  Pt went to a preop class and is not interested in AE.  She wants to try her comfort height commode at home:  min A needed at time of evaluation.  Told her to let PT know if she is having difficulty and they can get a 3:1 if needed     Vision     Perception     Praxis      Pertinent Vitals/Pain Pain Score: 4  Pain Location: L knee Pain  Descriptors / Indicators: Sore Pain Intervention(s): Limited activity within patient's tolerance;Monitored during session;Premedicated before session;Repositioned     Hand Dominance     Extremity/Trunk Assessment Upper Extremity Assessment Upper Extremity Assessment: Overall WFL for tasks assessed           Communication Communication Communication: No difficulties   Cognition Arousal/Alertness: Awake/alert Behavior During Therapy: WFL for tasks assessed/performed Overall Cognitive Status: Within Functional Limits for tasks assessed                     General Comments       Exercises       Shoulder Instructions      Home Living Family/patient expects to be discharged to:: Private residence Living Arrangements: Spouse/significant other   Type of Home: House             Bathroom Shower/Tub: Hospital doctor Toilet: Handicapped height     Home Equipment: Civil engineer, contracting;Shower seat - built in          Prior Functioning/Environment Level of Independence: Independent  OT Diagnosis: Generalized weakness   OT Problem List:     OT Treatment/Interventions:      OT Goals(Current goals can be found in the care plan section) Acute Rehab OT Goals Patient Stated Goal: home  OT Frequency:     Barriers to D/C:            Co-evaluation              End of Session    Activity Tolerance: Patient tolerated treatment well Patient left: in chair;with call bell/phone within reach   Time: 8270-7867 OT Time Calculation (min): 20 min Charges:  OT General Charges $OT Visit: 1 Procedure OT Evaluation $Initial OT Evaluation Tier I: 1 Procedure G-Codes:    Kimarion Chery 2014-09-12, 9:20 AM  Lesle Chris, OTR/L [phone] 09/12/14

## 2014-08-31 NOTE — Discharge Summary (Signed)
Physician Discharge Summary   Patient ID: Tamara Anthony MRN: [id number] DOB/AGE: 11/16/1940 74 y.o.  Admit date: 08/30/2014 Discharge date: 09/01/2014  Primary Diagnosis:  Osteoarthritis Left knee(s) Admission Diagnoses:  Past Medical History  Diagnosis Date  . Hypertension   . Hyperlipidemia   . S/P vaginal hysterectomy     03/2012  . Diabetes mellitus     type 2  . Colon polyps   . Arthritis    Discharge Diagnoses:   Principal Problem:   OA (osteoarthritis) of knee  Estimated body mass index is 30.55 kg/(m^2) as calculated from the following:   Height as of this encounter: 5' 5"  (1.651 m).   Weight as of this encounter: 83.28 kg (183 lb 9.6 oz).  Procedure:  Procedure(s) (LRB): TOTAL LEFT KNEE ARTHROPLASTY (Left)   Consults: None  HPI: Tamara Anthony is a 74 y.o. 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 She has constant pain, with activity and at rest and significant functional deficits with difficulties even with ADLs. She has had extensive non-op management including analgesics, injections of cortisone and viscosupplements, and home exercise program, but remains in significant pain with significant dysfunction. Radiographs show bone on bone arthritis. She presents now for left Total Knee Arthroplasty.   Laboratory Data: Admission on 08/30/2014, Discharged on 09/01/2014  Component Date Value Ref Range Status  . Glucose-Capillary 08/30/2014 120* 70 - 99 mg/dL Final  . Glucose-Capillary 08/30/2014 107* 70 - 99 mg/dL Final  . WBC 08/31/2014 11.4* 4.0 - 10.5 K/uL Final  . RBC 08/31/2014 4.20  3.87 - 5.11 MIL/uL Final  . Hemoglobin 08/31/2014 12.2  12.0 - 15.0 g/dL Final  . HCT 08/31/2014 37.4  36.0 - 46.0 % Final  . MCV 08/31/2014 89.0  78.0 - 100.0 fL Final  . MCH 08/31/2014 29.0  26.0 - 34.0 pg Final  . MCHC 08/31/2014 32.6  30.0 - 36.0 g/dL Final  . RDW 08/31/2014 13.0  11.5 - 15.5 % Final  . Platelets 08/31/2014  230  150 - 400 K/uL Final  . Sodium 08/31/2014 140  135 - 145 mmol/L Final  . Potassium 08/31/2014 4.2  3.5 - 5.1 mmol/L Final  . Chloride 08/31/2014 108  96 - 112 mmol/L Final  . CO2 08/31/2014 26  19 - 32 mmol/L Final  . Glucose, Bld 08/31/2014 117* 70 - 99 mg/dL Final  . BUN 08/31/2014 16  6 - 23 mg/dL Final  . Creatinine, Ser 08/31/2014 0.69  0.50 - 1.10 mg/dL Final  . Calcium 08/31/2014 8.7  8.4 - 10.5 mg/dL Final  . GFR calc non Af Amer 08/31/2014 85* >90 mL/min Final  . GFR calc Af Amer 08/31/2014 >90  >90 mL/min Final   Comment: (NOTE) The eGFR has been calculated using the CKD EPI equation. This calculation has not been validated in all clinical situations. eGFR's persistently <90 mL/min signify possible Chronic Kidney Disease.   . Anion gap 08/31/2014 6  5 - 15 Final  . WBC 09/01/2014 11.3* 4.0 - 10.5 K/uL Final  . RBC 09/01/2014 3.61* 3.87 - 5.11 MIL/uL Final  . Hemoglobin 09/01/2014 10.7* 12.0 - 15.0 g/dL Final  . HCT 09/01/2014 32.0* 36.0 - 46.0 % Final  . MCV 09/01/2014 88.6  78.0 - 100.0 fL Final  . MCH 09/01/2014 29.6  26.0 - 34.0 pg Final  . MCHC 09/01/2014 33.4  30.0 - 36.0 g/dL Final  . RDW 09/01/2014 13.0  11.5 - 15.5 % Final  .  Platelets 09/01/2014 204  150 - 400 K/uL Final  . Sodium 09/01/2014 138  135 - 145 mmol/L Final  . Potassium 09/01/2014 4.0  3.5 - 5.1 mmol/L Final  . Chloride 09/01/2014 104  96 - 112 mmol/L Final  . CO2 09/01/2014 27  19 - 32 mmol/L Final  . Glucose, Bld 09/01/2014 121* 70 - 99 mg/dL Final  . BUN 09/01/2014 12  6 - 23 mg/dL Final  . Creatinine, Ser 09/01/2014 0.64  0.50 - 1.10 mg/dL Final  . Calcium 09/01/2014 8.8  8.4 - 10.5 mg/dL Final  . GFR calc non Af Amer 09/01/2014 87* >90 mL/min Final  . GFR calc Af Amer 09/01/2014 >90  >90 mL/min Final   Comment: (NOTE) The eGFR has been calculated using the CKD EPI equation. This calculation has not been validated in all clinical situations. eGFR's persistently <90 mL/min signify  possible Chronic Kidney Disease.   Georgiann Hahn gap 09/01/2014 7  5 - 15 Final  Hospital Outpatient Visit on 08/25/2014  Component Date Value Ref Range Status  . aPTT 08/25/2014 29  24 - 37 seconds Final  . WBC 08/25/2014 6.3  4.0 - 10.5 K/uL Final  . RBC 08/25/2014 4.79  3.87 - 5.11 MIL/uL Final  . Hemoglobin 08/25/2014 14.0  12.0 - 15.0 g/dL Final  . HCT 08/25/2014 42.7  36.0 - 46.0 % Final  . MCV 08/25/2014 89.1  78.0 - 100.0 fL Final  . MCH 08/25/2014 29.2  26.0 - 34.0 pg Final  . MCHC 08/25/2014 32.8  30.0 - 36.0 g/dL Final  . RDW 08/25/2014 13.1  11.5 - 15.5 % Final  . Platelets 08/25/2014 266  150 - 400 K/uL Final  . Sodium 08/25/2014 138  135 - 145 mmol/L Final  . Potassium 08/25/2014 4.5  3.5 - 5.1 mmol/L Final  . Chloride 08/25/2014 104  96 - 112 mmol/L Final  . CO2 08/25/2014 29  19 - 32 mmol/L Final  . Glucose, Bld 08/25/2014 104* 70 - 99 mg/dL Final  . BUN 08/25/2014 22  6 - 23 mg/dL Final  . Creatinine, Ser 08/25/2014 0.75  0.50 - 1.10 mg/dL Final  . Calcium 08/25/2014 9.5  8.4 - 10.5 mg/dL Final  . Total Protein 08/25/2014 7.3  6.0 - 8.3 g/dL Final  . Albumin 08/25/2014 4.1  3.5 - 5.2 g/dL Final  . AST 08/25/2014 17  0 - 37 U/L Final  . ALT 08/25/2014 16  0 - 35 U/L Final  . Alkaline Phosphatase 08/25/2014 59  39 - 117 U/L Final  . Total Bilirubin 08/25/2014 0.5  0.3 - 1.2 mg/dL Final  . GFR calc non Af Amer 08/25/2014 83* >90 mL/min Final  . GFR calc Af Amer 08/25/2014 >90  >90 mL/min Final   Comment: (NOTE) The eGFR has been calculated using the CKD EPI equation. This calculation has not been validated in all clinical situations. eGFR's persistently <90 mL/min signify possible Chronic Kidney Disease.   . Anion gap 08/25/2014 5  5 - 15 Final  . Prothrombin Time 08/25/2014 12.8  11.6 - 15.2 seconds Final  . INR 08/25/2014 0.95  0.00 - 1.49 Final  . ABO/RH(D) 08/25/2014 A POS   Final  . Antibody Screen 08/25/2014 NEG   Final  . Sample Expiration 08/25/2014  09/02/2014   Final  . Color, Urine 08/25/2014 YELLOW  YELLOW Final  . APPearance 08/25/2014 CLEAR  CLEAR Final  . Specific Gravity, Urine 08/25/2014 1.007  1.005 - 1.030 Final  . pH  08/25/2014 6.0  5.0 - 8.0 Final  . Glucose, UA 08/25/2014 NEGATIVE  NEGATIVE mg/dL Final  . Hgb urine dipstick 08/25/2014 NEGATIVE  NEGATIVE Final  . Bilirubin Urine 08/25/2014 NEGATIVE  NEGATIVE Final  . Ketones, ur 08/25/2014 NEGATIVE  NEGATIVE mg/dL Final  . Protein, ur 08/25/2014 NEGATIVE  NEGATIVE mg/dL Final  . Urobilinogen, UA 08/25/2014 0.2  0.0 - 1.0 mg/dL Final  . Nitrite 08/25/2014 NEGATIVE  NEGATIVE Final  . Leukocytes, UA 08/25/2014 NEGATIVE  NEGATIVE Final   MICROSCOPIC NOT DONE ON URINES WITH NEGATIVE PROTEIN, BLOOD, LEUKOCYTES, NITRITE, OR GLUCOSE <1000 mg/dL.  Marland Kitchen MRSA, PCR 08/25/2014 NEGATIVE  NEGATIVE Final  . Staphylococcus aureus 08/25/2014 NEGATIVE  NEGATIVE Final   Comment:        The Xpert SA Assay (FDA approved for NASAL specimens in patients over 12 years of age), is one component of a comprehensive surveillance program.  Test performance has been validated by [facility] for patients greater than or equal to 63 year old. It is not intended to diagnose infection nor to guide or monitor treatment.   . ABO/RH(D) 08/25/2014 A POS   Final     X-Rays:No results found.  EKG: Orders placed or performed during the hospital encounter of 03/20/12  . EKG     Hospital Course: Tamara Anthony is a 74 y.o. who was admitted to [facility]. They were brought to the operating room on 08/30/2014 and underwent Procedure(s): TOTAL LEFT KNEE ARTHROPLASTY.  Patient tolerated the procedure well and was later transferred to the recovery room and then to the orthopaedic floor for postoperative care.  They were given PO and IV analgesics for pain control following their surgery.  They were given 24 hours of postoperative antibiotics of  Anti-infectives    Start     Dose/Rate Route Frequency  Ordered Stop   08/30/14 1530  ceFAZolin (ANCEF) IVPB 2 g/50 mL premix     2 g 100 mL/hr over 30 Minutes Intravenous Every 6 hours 08/30/14 1153 08/30/14 2134   08/30/14 0751  ceFAZolin (ANCEF) IVPB 2 g/50 mL premix     2 g 100 mL/hr over 30 Minutes Intravenous On call to O.R. 08/30/14 2694 08/30/14 0918     and started on DVT prophylaxis in the form of Xarelto.   PT and OT were ordered for total joint protocol.  Discharge planning consulted to help with postop disposition and equipment needs.  Patient had a good night on the evening of surgery and was sitting up in bed on the morning of day one.  They started to get up OOB with therapy on day one. Hemovac drain was pulled without difficulty.  Continued to work with therapy into day two.  Dressing was changed on day two and the incision was healing well.  Patient was seen in rounds and was ready to go home later on day two.   Discharge home with home health Diet - Cardiac diet and Diabetic diet Follow up - in 2 weeks Activity - WBAT Disposition - Home Condition Upon Discharge - Good D/C Meds - See DC Summary DVT Prophylaxis - Xarelto       Discharge Instructions    Call MD / Call 911    Complete by:  As directed   If you experience chest pain or shortness of breath, CALL 911 and be transported to the hospital emergency room.  If you develope a fever above 101 F, pus (white drainage) or increased drainage or redness at the  wound, or calf pain, call your surgeon's office.     Change dressing    Complete by:  As directed   Change dressing daily with sterile 4 x 4 inch gauze dressing and apply TED hose. Do not submerge the incision under water.     Constipation Prevention    Complete by:  As directed   Drink plenty of fluids.  Prune juice may be helpful.  You may use a stool softener, such as Colace (over the counter) 100 mg twice a day.  Use MiraLax (over the counter) for constipation as needed.     Diet - low sodium heart healthy     Complete by:  As directed      Diet Carb Modified    Complete by:  As directed      Discharge instructions    Complete by:  As directed   Pick up stool softner and laxative for home use following surgery while on pain medications. Do not submerge incision under water. Please use good hand washing techniques while changing dressing each day. May shower starting three days after surgery. Please use a clean towel to pat the incision dry following showers. Continue to use ice for pain and swelling after surgery. Do not use any lotions or creams on the incision until instructed by your surgeon.  Take Xarelto for two and a half more weeks, then discontinue Xarelto. Once the patient has completed the Xarelto, they may resume the 81 mg Aspirin.  Postoperative Constipation Protocol  Constipation - defined medically as fewer than three stools per week and severe constipation as less than one stool per week.  One of the most common issues patients have following surgery is constipation.  Even if you have a regular bowel pattern at home, your normal regimen is likely to be disrupted due to multiple reasons following surgery.  Combination of anesthesia, postoperative narcotics, change in appetite and fluid intake all can affect your bowels.  In order to avoid complications following surgery, here are some recommendations in order to help you during your recovery period.  Colace (docusate) - Pick up an over-the-counter form of Colace or another stool softener and take twice a day as long as you are requiring postoperative pain medications.  Take with a full glass of water daily.  If you experience loose stools or diarrhea, hold the colace until you stool forms back up.  If your symptoms do not get better within 1 week or if they get worse, check with your doctor.  Dulcolax (bisacodyl) - Pick up over-the-counter and take as directed by the product packaging as needed to assist with the movement of your  bowels.  Take with a full glass of water.  Use this product as needed if not relieved by Colace only.   MiraLax (polyethylene glycol) - Pick up over-the-counter to have on hand.  MiraLax is a solution that will increase the amount of water in your bowels to assist with bowel movements.  Take as directed and can mix with a glass of water, juice, soda, coffee, or tea.  Take if you go more than two days without a movement. Do not use MiraLax more than once per day. Call your doctor if you are still constipated or irregular after using this medication for 7 days in a row.  If you continue to have problems with postoperative constipation, please contact the office for further assistance and recommendations.  If you experience "the worst abdominal pain ever" or develop nausea  or vomiting, please contact the office immediatly for further recommendations for treatment.     Do not put a pillow under the knee. Place it under the heel.    Complete by:  As directed      Do not sit on low chairs, stoools or toilet seats, as it may be difficult to get up from low surfaces    Complete by:  As directed      Driving restrictions    Complete by:  As directed   No driving until released by the physician.     Increase activity slowly as tolerated    Complete by:  As directed      Lifting restrictions    Complete by:  As directed   No lifting until released by the physician.     Patient may shower    Complete by:  As directed   You may shower without a dressing once there is no drainage.  Do not wash over the wound.  If drainage remains, do not shower until drainage stops.     TED hose    Complete by:  As directed   Use stockings (TED hose) for 3 weeks on both leg(s).  You may remove them at night for sleeping.     Weight bearing as tolerated    Complete by:  As directed   Laterality:  left  Extremity:  Lower            Medication List    STOP taking these medications        aspirin 81 MG tablet       b complex vitamins capsule     multivitamin with minerals tablet     NONFORMULARY OR COMPOUNDED ITEM     OMEGA-3 CF PO     oxyCODONE-acetaminophen 5-325 MG per tablet  Commonly known as:  PERCOCET/ROXICET      TAKE these medications        carvedilol 40 MG 24 hr capsule  Commonly known as:  COREG CR  Take 40 mg by mouth every morning.     docusate sodium 100 MG capsule  Commonly known as:  COLACE  Take 100 mg by mouth as needed for mild constipation. Takes 2 tablets as needed     hydrochlorothiazide 12.5 MG capsule  Commonly known as:  MICROZIDE  Take 12.5 mg by mouth every morning.     methocarbamol 500 MG tablet  Commonly known as:  ROBAXIN  Take 1 tablet (500 mg total) by mouth every 6 (six) hours as needed for muscle spasms.     metoCLOPramide 10 MG tablet  Commonly known as:  REGLAN  Take 1 tablet (10 mg total) by mouth 3 (three) times daily with meals.     MIRALAX PO  Take by mouth as needed.     oxyCODONE 5 MG immediate release tablet  Commonly known as:  Oxy IR/ROXICODONE  Take 1-2 tablets (5-10 mg total) by mouth every 3 (three) hours as needed for moderate pain, severe pain or breakthrough pain.     psyllium 58.6 % powder  Commonly known as:  METAMUCIL  Take 1 packet by mouth as needed.     quinapril 40 MG tablet  Commonly known as:  ACCUPRIL  Take 40 mg by mouth at bedtime.     rivaroxaban 10 MG Tabs tablet  Commonly known as:  XARELTO  - Take 1 tablet (10 mg total) by mouth daily with breakfast. Take Xarelto for two and a  half more weeks, then discontinue Xarelto.  - Once the patient has completed the Xarelto, they may resume the 81 mg Aspirin.     rosuvastatin 5 MG tablet  Commonly known as:  CRESTOR  Take 5 mg by mouth every Monday, Wednesday, and Friday.     traMADol 50 MG tablet  Commonly known as:  ULTRAM  Take 1-2 tablets (50-100 mg total) by mouth every 6 (six) hours as needed (mild pain).       Follow-up Information    Follow up  with [facility].   Why:  PT   Contact information:   [place] [address] [phone]       Follow up with [name], MD. Schedule an appointment as soon as possible for a visit on 09/14/2014.   Specialty:  Orthopedic Surgery   Why:  Call office at [phone] to setup two week appointment with Dr. Wynelle Link on Tuesday 09/14/2014.   Contact information:   [address] [place] [address] [phone]       Signed: Arlee Muslim, PA-C Orthopaedic Surgery 09/02/2014, 7:53 AM

## 2014-09-01 LAB — BASIC METABOLIC PANEL
ANION GAP: 7 (ref 5–15)
BUN: 12 mg/dL (ref 6–23)
CALCIUM: 8.8 mg/dL (ref 8.4–10.5)
CHLORIDE: 104 mmol/L (ref 96–112)
CO2: 27 mmol/L (ref 19–32)
CREATININE: 0.64 mg/dL (ref 0.50–1.10)
GFR calc non Af Amer: 87 mL/min — ABNORMAL LOW (ref >90)
Glucose, Bld: 121 mg/dL — ABNORMAL HIGH (ref 70–99)
Potassium: 4 mmol/L (ref 3.5–5.1)
SODIUM: 138 mmol/L (ref 135–145)

## 2014-09-01 LAB — CBC
HCT: 32 % — ABNORMAL LOW (ref 36.0–46.0)
Hemoglobin: 10.7 g/dL — ABNORMAL LOW (ref 12.0–15.0)
MCH: 29.6 pg (ref 26.0–34.0)
MCHC: 33.4 g/dL (ref 30.0–36.0)
MCV: 88.6 fL (ref 78.0–100.0)
Platelets: 204 10*3/uL (ref 150–400)
RBC: 3.61 MIL/uL — AB (ref 3.87–5.11)
RDW: 13 % (ref 11.5–15.5)
WBC: 11.3 10*3/uL — AB (ref 4.0–10.5)

## 2014-09-01 NOTE — Progress Notes (Signed)
Physical Therapy Treatment Patient Details Name: Tamara Anthony MRN: [id number] DOB: 1940-12-26 Today's Date: 09/01/2014    History of Present Illness Pt is a 74 year old female s/p L TKA.    PT Comments    POD # 2 AM session; PT was OOB in chair, husband present in room; placed immobilizer on L knee; sit to stand ambulate with walker 100 ft in unit hall and one step; Education emphasized with pt and husband stairs and ambulating; PT education applying and taking off Knee immobilizer, ice wrap, and home exercises; Handout provided to patient with exercises; Returned pt to room and into chair; ice applied.    Follow Up Recommendations  Home health PT     Equipment Recommendations  Rolling walker with 5" wheels    Recommendations for Other Services       Precautions / Restrictions Precautions Precautions: Knee Required Braces or Orthoses: Knee Immobilizer - Left Restrictions Other Position/Activity Restrictions: WBAT    Mobility  Bed Mobility               General bed mobility comments: Pt was OOB in Chair   Transfers Overall transfer level: Needs assistance Equipment used: Rolling walker (2 wheeled) Transfers: Sit to/from Stand Sit to Stand: Min assist         General transfer comment: Verbal Cues for hand placement; when sit to stand chair to walker  Ambulation/Gait Ambulation/Gait assistance: Min assist Ambulation Distance (Feet): 100 Feet Assistive device: Rolling walker (2 wheeled) Gait Pattern/deviations: Step-to pattern;Decreased stride length;Trunk flexed     General Gait Details: Pt cue to more up right.    Stairs Stairs: Yes Stairs assistance: Min assist Stair Management: Step to pattern;Forwards;With walker Number of Stairs: 1 General stair comments: Pt required VC to get close to step with feet; then walker up; going down pt required cues to get close to end of step;   Wheelchair Mobility    Modified Rankin (Stroke Patients Only)        Balance                                    Cognition Arousal/Alertness: Awake/alert Behavior During Therapy: WFL for tasks assessed/performed Overall Cognitive Status: Within Functional Limits for tasks assessed                      Exercises Total Joint Exercises Ankle Circles/Pumps: AROM;Both;Seated;20 reps Quad Sets: AROM;Seated;Left;10 reps Towel Squeeze: AROM;Seated;Both;10 reps Short Arc Quad: AAROM;Seated;Left;5 reps (Pt required cues and assit adjust towel ) Heel Slides: AAROM;Seated;Left;10 reps Hip ABduction/ADduction: AROM;Left;10 reps;Seated Straight Leg Raises: AAROM;Seated;Left;10 reps    General Comments        Pertinent Vitals/Pain Pain Assessment: 0-10 Pain Score: 5  (3/10 before ambulating 5/10 after exercises ) Pain Location: L Knee Pain Intervention(s): Monitored during session;Repositioned;Ice applied    Home Living                      Prior Function            PT Goals (current goals can now be found in the care plan section) Progress towards PT goals: Progressing toward goals    Frequency  7X/week    PT Plan      Co-evaluation             End of Session Equipment Utilized During Treatment: Gait belt;Left knee immobilizer Activity  Tolerance: Patient tolerated treatment well Patient left: in chair;with family/visitor present;with call bell/phone within reach     Time: 4210-3128 PT Time Calculation (min) (ACUTE ONLY): 39 min  Charges:  $Gait Training: 8-22 mins $Therapeutic Exercise: 8-22 mins $Therapeutic Activity: 8-22 mins                    G Codes:      Duric,Sreto Student PTA  09/01/2014, 1:22 PM  Rica Koyanagi  PTA [facility]  Acute  Rehab Pager      [phone]

## 2014-09-01 NOTE — Plan of Care (Signed)
Problem: Phase III Progression Outcomes Goal: Anticoagulant follow-up in place Outcome: Not Applicable Date Met:  52/17/47 Xarelto VTE, no f/u needed.

## 2014-09-01 NOTE — Progress Notes (Signed)
   Subjective: 2 Days Post-Op Procedure(s) (LRB): TOTAL LEFT KNEE ARTHROPLASTY (Left) Patient reports pain as mild.   Patient seen in rounds for Dr. Wynelle Link. Patient is well, but has had some minor complaints of pain in the knee, requiring pain medications Patient is ready to go home later today after therapy.  Objective: Vital signs in last 24 hours: Temp:  [97.9 F (36.6 C)-99.2 F (37.3 C)] 98.5 F (36.9 C) (04/20 0641) Pulse Rate:  [78-85] 84 (04/20 0641) Resp:  [14-16] 16 (04/20 0641) BP: (130-140)/(48-74) 140/48 mmHg (04/20 0641) SpO2:  [100 %] 100 % (04/20 0641)  Intake/Output from previous day:  Intake/Output Summary (Last 24 hours) at 09/01/14 1002 Last data filed at 09/01/14 0300  Gross per 24 hour  Intake    760 ml  Output   1700 ml  Net   -940 ml    Labs:  Recent Labs  08/31/14 0428 09/01/14 0523  HGB 12.2 10.7*    Recent Labs  08/31/14 0428 09/01/14 0523  WBC 11.4* 11.3*  RBC 4.20 3.61*  HCT 37.4 32.0*  PLT 230 204    Recent Labs  08/31/14 0428 09/01/14 0523  NA 140 138  K 4.2 4.0  CL 108 104  CO2 26 27  BUN 16 12  CREATININE 0.69 0.64  GLUCOSE 117* 121*  CALCIUM 8.7 8.8   No results for input(s): LABPT, INR in the last 72 hours.  EXAM: General - Patient is Alert, Appropriate and Oriented Extremity - Neurovascular intact Sensation intact distally Dorsiflexion/Plantar flexion intact Incision - clean, dry, no drainage, healing Motor Function - intact, moving foot and toes well on exam.   Assessment/Plan: 2 Days Post-Op Procedure(s) (LRB): TOTAL LEFT KNEE ARTHROPLASTY (Left) Procedure(s) (LRB): TOTAL LEFT KNEE ARTHROPLASTY (Left) Past Medical History  Diagnosis Date  . Hypertension   . Hyperlipidemia   . S/P vaginal hysterectomy     03/2012  . Diabetes mellitus     type 2  . Colon polyps   . Arthritis    Principal Problem:   OA (osteoarthritis) of knee  Estimated body mass index is 30.55 kg/(m^2) as calculated from  the following:   Height as of this encounter: 5\' 5"  (1.651 m).   Weight as of this encounter: 83.28 kg (183 lb 9.6 oz). Up with therapy Discharge home with home health Diet - Cardiac diet and Diabetic diet Follow up - in 2 weeks Activity - WBAT Disposition - Home Condition Upon Discharge - Good D/C Meds - See DC Summary DVT Prophylaxis - Xarelto  Arlee Muslim, PA-C Orthopaedic Surgery 09/01/2014, 10:02 AM

## 2014-11-08 ENCOUNTER — Other Ambulatory Visit: Payer: Self-pay

## 2015-02-23 ENCOUNTER — Ambulatory Visit (INDEPENDENT_AMBULATORY_CARE_PROVIDER_SITE_OTHER): Payer: Medicare Other | Admitting: Gynecology

## 2015-02-23 ENCOUNTER — Encounter: Payer: Self-pay | Admitting: Gynecology

## 2015-02-23 VITALS — BP 118/80 | Ht 65.0 in | Wt 187.0 lb

## 2015-02-23 DIAGNOSIS — Z79890 Hormone replacement therapy (postmenopausal): Secondary | ICD-10-CM | POA: Diagnosis not present

## 2015-02-23 DIAGNOSIS — N952 Postmenopausal atrophic vaginitis: Secondary | ICD-10-CM

## 2015-02-23 DIAGNOSIS — Z01419 Encounter for gynecological examination (general) (routine) without abnormal findings: Secondary | ICD-10-CM

## 2015-02-23 MED ORDER — NONFORMULARY OR COMPOUNDED ITEM: Status: DC

## 2015-02-23 NOTE — Progress Notes (Signed)
Tamara Anthony [birth year] [id number]   History:    74 y.o.  for annual gyn exam with no complaints today. Patient with past history of transvaginal hysterectomy with anterior colporrhaphy as a result of first-degree uterine prolapse and second-degree cystocele. Her ovaries were atrophic and were not seen so bilateral salpingo-oophorectomy was not done. She is asymptomatic and doing well otherwise. Her pathology reported benign. She is currently on estradiol 0.02% transvaginally which she uses once a week. Her PCP has been doing her blood work. Patient declined flu vaccine today. Patient had a normal bone density study in 2015. She had a colonoscopy in February this year benign polyps were removed. Patient would no prior history of any abnormal Pap smear.  Past medical history,surgical history, family history and social history were all reviewed and documented in the EPIC chart.  Gynecologic History No LMP recorded. Patient is postmenopausal. Contraception: status post hysterectomy Last Pap: 2010. Results were: normal Last mammogram: 2015. Results were: normal  Obstetric History OB History  Gravida Para Term Preterm AB SAB TAB Ectopic Multiple Living  2 2 2       2     # Outcome Date GA Lbr Len/2nd Weight Sex Delivery Anes PTL Lv  2 Term     F Vag-Spont  N Y  1 Term     F Vag-Spont  N Y       ROS: A ROS was performed and pertinent positives and negatives are included in the history.  GENERAL: No fevers or chills. HEENT: No change in vision, no earache, sore throat or sinus congestion. NECK: No pain or stiffness. CARDIOVASCULAR: No chest pain or pressure. No palpitations. PULMONARY: No shortness of breath, cough or wheeze. GASTROINTESTINAL: No abdominal pain, nausea, vomiting or diarrhea, melena or bright red blood per rectum. GENITOURINARY: No urinary frequency, urgency, hesitancy or dysuria. MUSCULOSKELETAL: No joint or muscle pain, no back pain, no recent trauma. DERMATOLOGIC: No rash, no  itching, no lesions. ENDOCRINE: No polyuria, polydipsia, no heat or cold intolerance. No recent change in weight. HEMATOLOGICAL: No anemia or easy bruising or bleeding. NEUROLOGIC: No headache, seizures, numbness, tingling or weakness. PSYCHIATRIC: No depression, no loss of interest in normal activity or change in sleep pattern.     Exam: chaperone present  BP 118/80 mmHg  Ht 5\' 5"  (1.651 m)  Wt 187 lb (84.823 kg)  BMI 31.12 kg/m2  Body mass index is 31.12 kg/(m^2).  General appearance : Well developed well nourished female. No acute distress HEENT: Eyes: no retinal hemorrhage or exudates,  Neck supple, trachea midline, no carotid bruits, no thyroidmegaly Lungs: Clear to auscultation, no rhonchi or wheezes, or rib retractions  Heart: Regular rate and rhythm, no murmurs or gallops Breast:Examined in sitting and supine position were symmetrical in appearance, no palpable masses or tenderness,  no skin retraction, no nipple inversion, no nipple discharge, no skin discoloration, no axillary or supraclavicular lymphadenopathy Abdomen: no palpable masses or tenderness, no rebound or guarding Extremities: no edema or skin discoloration or tenderness  Pelvic:  Bartholin, Urethra, Skene Glands: Within normal limits             Vagina: No gross lesions or discharge  Cervix: Absent  Uterus  absent  Adnexa  Without masses or tenderness  Anus and perineum  normal   Rectovaginal  normal sphincter tone without palpated masses or tenderness             Hemoccult colonoscopy this year benign polyps removed  Assessment/Plan:  74 y.o. female for annual exam doing well with vaginal estrogen once a week for vaginal atrophy. PCP has been doing her blood work. Pap smear no longer indicated according to the new guidelines. Patient was reminded to schedule her mammogram at the end of this year. Her bone density will be due next year. We discussed importance of calcium vitamin D and weightbearing exercises  for osteoporosis prevention. Patient declined flu vaccine.   Terrance Mass MD, 2:56 PM 02/23/2015

## 2015-03-28 ENCOUNTER — Other Ambulatory Visit: Payer: Self-pay

## 2015-03-28 DIAGNOSIS — Z1231 Encounter for screening mammogram for malignant neoplasm of breast: Secondary | ICD-10-CM

## 2015-05-04 ENCOUNTER — Ambulatory Visit
Admission: RE | Admit: 2015-05-04 | Discharge: 2015-05-04 | Disposition: A | Discharge status: Home / Self Care | Payer: Medicare Other | Source: Ambulatory Visit

## 2015-05-04 DIAGNOSIS — Z1231 Encounter for screening mammogram for malignant neoplasm of breast: Secondary | ICD-10-CM

## 2015-09-20 DIAGNOSIS — E785 Hyperlipidemia, unspecified: Secondary | ICD-10-CM | POA: Insufficient documentation

## 2016-02-27 ENCOUNTER — Encounter: Payer: Self-pay | Admitting: Gynecology

## 2016-02-27 ENCOUNTER — Ambulatory Visit (INDEPENDENT_AMBULATORY_CARE_PROVIDER_SITE_OTHER): Payer: Medicare Other | Admitting: Gynecology

## 2016-02-27 VITALS — BP 120/70 | Ht 65.0 in | Wt 178.8 lb

## 2016-02-27 DIAGNOSIS — L739 Follicular disorder, unspecified: Secondary | ICD-10-CM

## 2016-02-27 DIAGNOSIS — N9089 Other specified noninflammatory disorders of vulva and perineum: Secondary | ICD-10-CM | POA: Diagnosis not present

## 2016-02-27 DIAGNOSIS — R1900 Intra-abdominal and pelvic swelling, mass and lump, unspecified site: Secondary | ICD-10-CM

## 2016-02-27 DIAGNOSIS — Z01411 Encounter for gynecological examination (general) (routine) with abnormal findings: Secondary | ICD-10-CM

## 2016-02-27 MED ORDER — DOXYCYCLINE HYCLATE 100 MG PO CAPS: ORAL_CAPSULE | ORAL | 0 refills | Status: DC

## 2016-02-27 NOTE — Patient Instructions (Signed)
Folliculitis °Folliculitis is redness, soreness, and swelling (inflammation) of the hair follicles. This condition can occur anywhere on the body. People with weakened immune systems, diabetes, or obesity have a greater risk of getting folliculitis. °CAUSES °· Bacterial infection. This is the most common cause. °· Fungal infection. °· Viral infection. °· Contact with certain chemicals, especially oils and tars. °Long-term folliculitis can result from bacteria that live in the nostrils. The bacteria may trigger multiple outbreaks of folliculitis over time. °SYMPTOMS °Folliculitis most commonly occurs on the scalp, thighs, legs, back, buttocks, and areas where hair is shaved frequently. An early sign of folliculitis is a small, white or yellow, pus-filled, itchy lesion (pustule). These lesions appear on a red, inflamed follicle. They are usually less than 0.2 inches (5 mm) wide. When there is an infection of the follicle that goes deeper, it becomes a boil or furuncle. A group of closely packed boils creates a larger lesion (carbuncle). Carbuncles tend to occur in hairy, sweaty areas of the body. °DIAGNOSIS  °Your caregiver can usually tell what is wrong by doing a physical exam. A sample may be taken from one of the lesions and tested in a lab. This can help determine what is causing your folliculitis. °TREATMENT  °Treatment may include: °· Applying warm compresses to the affected areas. °· Taking antibiotic medicines orally or applying them to the skin. °· Draining the lesions if they contain a large amount of pus or fluid. °· Laser hair removal for cases of long-lasting folliculitis. This helps to prevent regrowth of the hair. °HOME CARE INSTRUCTIONS °· Apply warm compresses to the affected areas as directed by your caregiver. °· If antibiotics are prescribed, take them as directed. Finish them even if you start to feel better. °· You may take over-the-counter medicines to relieve itching. °· Do not shave irritated  skin. °· Follow up with your caregiver as directed. °SEEK IMMEDIATE MEDICAL CARE IF:  °· You have increasing redness, swelling, or pain in the affected area. °· You have a fever. °MAKE SURE YOU: °· Understand these instructions. °· Will watch your condition. °· Will get help right away if you are not doing well or get worse. °  °This information is not intended to replace advice given to you by your health care provider. Make sure you discuss any questions you have with your health care provider. °  °Document Released: 07/09/2001 Document Revised: 05/21/2014 Document Reviewed: 07/31/2011 °Elsevier Interactive Patient Education ©2016 [employer] ° °

## 2016-02-27 NOTE — Addendum Note (Signed)
Addended by: Thurnell Garbe A on: 02/27/2016 11:47 AM   Modules accepted: Orders

## 2016-02-27 NOTE — Progress Notes (Signed)
Tamara Anthony 07/23/1940 [id number]   History:    75 y.o.  for annual gyn exam complaining of a vulvar irritated area that started a few days ago. Patient not sexually active. Patient no longer using vaginal estrogen for atrophy twice a week. She reports no vaginal bleeding. Patient with past history of transvaginal hysterectomy with anterior colporrhaphy as a result of first-degree uterine prolapse and second-degree cystocele. Her ovaries were atrophic and were not seen so bilateral salpingo-oophorectomy was not done. She is asymptomatic and doing well otherwise. Her pathology reported benign. Her PCP has been doing her blood work. Patient declined flu vaccine today. Patient had a normal bone density study in 2015. She had a colonoscopy in 2016 demonstrated benign polyps. She is on a 3 year recall. Patient with no previous history of any abnormal Pap smears.  Past medical history,surgical history, family history and social history were all reviewed and documented in the EPIC chart.  Gynecologic History No LMP recorded. Patient is postmenopausal. Contraception: status post hysterectomy Last Pap: Several years ago. Results were: normal Last mammogram: 2016. Results were: normal  Obstetric History OB History  Gravida Para Term Preterm AB Living  2 2 2     2   SAB TAB Ectopic Multiple Live Births          2    # Outcome Date GA Lbr Len/2nd Weight Sex Delivery Anes PTL Lv  2 Term     F Vag-Spont  N LIV  1 Term     F Vag-Spont  N LIV       ROS: A ROS was performed and pertinent positives and negatives are included in the history.  GENERAL: No fevers or chills. HEENT: No change in vision, no earache, sore throat or sinus congestion. NECK: No pain or stiffness. CARDIOVASCULAR: No chest pain or pressure. No palpitations. PULMONARY: No shortness of breath, cough or wheeze. GASTROINTESTINAL: No abdominal pain, nausea, vomiting or diarrhea, melena or bright red blood per rectum. GENITOURINARY: No  urinary frequency, urgency, hesitancy or dysuria. MUSCULOSKELETAL: No joint or muscle pain, no back pain, no recent trauma. DERMATOLOGIC: No rash, no itching, no lesions. ENDOCRINE: No polyuria, polydipsia, no heat or cold intolerance. No recent change in weight. HEMATOLOGICAL: No anemia or easy bruising or bleeding. NEUROLOGIC: No headache, seizures, numbness, tingling or weakness. PSYCHIATRIC: No depression, no loss of interest in normal activity or change in sleep pattern.     Exam: chaperone present  BP 120/70   Ht 5\' 5"  (1.651 m)   Wt 178 lb 12.8 oz (81.1 kg)   BMI 29.75 kg/m   Body mass index is 29.75 kg/m.  General appearance : Well developed well nourished female. No acute distress HEENT: Eyes: no retinal hemorrhage or exudates,  Neck supple, trachea midline, no carotid bruits, no thyroidmegaly Lungs: Clear to auscultation, no rhonchi or wheezes, or rib retractions  Heart: Regular rate and rhythm, no murmurs or gallops Breast:Examined in sitting and supine position were symmetrical in appearance, no palpable masses or tenderness,  no skin retraction, no nipple inversion, no nipple discharge, no skin discoloration, no axillary or supraclavicular lymphadenopathy Abdomen: no palpable masses or tenderness, no rebound or guarding Extremities: no edema or skin discoloration or tenderness  Pelvic:  Left groin crease to follicular lesions noted slightly draining culture obtained  Bartholin, Urethra, Skene Glands: Within normal limits             Vagina: No gross lesions or discharge  Cervix: Absent  Uterus  absent  Adnexa  Fullness left adnexa  Anus and perineum  normal   Rectovaginal  normal sphincter tone without palpated masses or tenderness             Hemoccult PCP provides     Assessment/Plan:  75 y.o. female for annual exam folliculitis of left groin culture for MRSA obtained. Patient obese started on Vibramycin 100 mg twice a day for 2 weeks. She is to do breathe the area  with soap and water daily with application Neosporin 2-3 times a week. Because of her adnexal fullness she'll return back to the office next week for pelvic ultrasound. Pap smear not indicated. Patient is schedule her bone density study and mammogram in December this year. She will need her follow-up colonoscopy next year since she is on a 3 year recall. Her PCP has been doing her blood work. Patient declined flu vaccine today.   Terrance Mass MD, 11:12 AM 02/27/2016

## 2016-03-01 ENCOUNTER — Encounter: Payer: Self-pay | Admitting: Podiatry

## 2016-03-01 ENCOUNTER — Ambulatory Visit (INDEPENDENT_AMBULATORY_CARE_PROVIDER_SITE_OTHER): Payer: Medicare Other | Admitting: Podiatry

## 2016-03-01 ENCOUNTER — Ambulatory Visit (INDEPENDENT_AMBULATORY_CARE_PROVIDER_SITE_OTHER): Payer: Medicare Other

## 2016-03-01 VITALS — BP 155/87 | HR 78

## 2016-03-01 DIAGNOSIS — M779 Enthesopathy, unspecified: Secondary | ICD-10-CM | POA: Diagnosis not present

## 2016-03-01 DIAGNOSIS — M79674 Pain in right toe(s): Secondary | ICD-10-CM

## 2016-03-01 DIAGNOSIS — M79604 Pain in right leg: Secondary | ICD-10-CM

## 2016-03-01 DIAGNOSIS — M7662 Achilles tendinitis, left leg: Secondary | ICD-10-CM | POA: Diagnosis not present

## 2016-03-01 DIAGNOSIS — B351 Tinea unguium: Secondary | ICD-10-CM

## 2016-03-01 DIAGNOSIS — M79605 Pain in left leg: Secondary | ICD-10-CM

## 2016-03-01 DIAGNOSIS — M79675 Pain in left toe(s): Secondary | ICD-10-CM

## 2016-03-01 MED ORDER — TRIAMCINOLONE ACETONIDE 10 MG/ML IJ SUSP
10.0000 mg | Freq: Once | INTRAMUSCULAR | Status: AC
  Administered 2016-03-01: 10 mg

## 2016-03-01 NOTE — Patient Instructions (Signed)

## 2016-03-01 NOTE — Progress Notes (Signed)
   Subjective:    Patient ID: Tamara Anthony, female    DOB: [birth year], 75 y.o.   MRN: [id number]  HPI    Review of Systems  All other systems reviewed and are negative.      Objective:   Physical Exam        Assessment & Plan:

## 2016-03-02 LAB — WOUND CULTURE
GRAM STAIN: NONE SEEN
Gram Stain: NONE SEEN
Organism ID, Bacteria: NORMAL

## 2016-03-05 NOTE — Progress Notes (Signed)
Subjective:     Patient ID: Makaiyah Suydam, female   DOB: Nov 12, 1940, 75 y.o.   MRN: [id number]  HPI patient presents stating that she's been having a lot of pain in the back of the left heel and also has nail disease that are hard for her to cut   Review of Systems     Objective:   Physical Exam Neurovascular status intact negative Homans sign was noted inflammation pain posterior aspect left heel with central and medial side doing well and good strength of the Achilles tendon. 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 Patient wants injection and did a careful lateral injection 3 mg dexamethasone Kenalog 5 mg Xylocaine and I then went ahead and debrided painful nailbeds 1-5 of both feet I reviewed x-rays of left foot today indicating spur but no indicating stress fracture or arthritis

## 2016-03-14 ENCOUNTER — Encounter: Payer: Self-pay | Admitting: Gynecology

## 2016-03-14 ENCOUNTER — Ambulatory Visit (INDEPENDENT_AMBULATORY_CARE_PROVIDER_SITE_OTHER): Payer: Medicare Other

## 2016-03-14 ENCOUNTER — Ambulatory Visit (INDEPENDENT_AMBULATORY_CARE_PROVIDER_SITE_OTHER): Payer: Medicare Other | Admitting: Gynecology

## 2016-03-14 VITALS — BP 128/80 | Ht 65.0 in | Wt 178.0 lb

## 2016-03-14 DIAGNOSIS — R1900 Intra-abdominal and pelvic swelling, mass and lump, unspecified site: Secondary | ICD-10-CM

## 2016-03-14 NOTE — Progress Notes (Signed)
   Patient is a 75 year old who was seen the office for her annual exam on October 16 she was doing well but at time of the exam there was a fullness noted on her left adnexa and this is the reason for the ultrasound today. Also at the left groin crease there was a follicular lesion slightly draining which was incised and drained and she was culture for MRSA for which it was not found. She did take her Vibramycin 100 mg twice day for 2 weeks and apply Neosporin 2-3 times a week and she states the area has completely healed.  Ultrasound: Absent uterus right and left ovary not seen. Excessive bowel shadowing right and left adnexa. No apparent adnexal masses. Vaginal cuff negative.  Assessment/plan: Patient completed recover from incision and drainage and antibiotic treatment for follicular abscess doing well. Fullness at time of exam was a segment of bowel that was palpated otherwise ultrasound today was normal and patient was reassured.  Greater than 90 percentile was spent counseling coronary care for this patient total time of consultation 10 minutes

## 2016-04-02 ENCOUNTER — Telehealth: Payer: Self-pay | Admitting: *Deleted

## 2016-04-02 DIAGNOSIS — M8580 Other specified disorders of bone density and structure, unspecified site: Secondary | ICD-10-CM

## 2016-04-02 NOTE — Telephone Encounter (Signed)
Pt called stating order need to be placed at breast center for dexa, per note on 02/27/16 "Patient is schedule her bone density study and mammogram in December this year" order placed. Pt will schedule.

## 2016-04-03 ENCOUNTER — Other Ambulatory Visit: Payer: Self-pay | Admitting: Gynecology

## 2016-04-03 DIAGNOSIS — Z1231 Encounter for screening mammogram for malignant neoplasm of breast: Secondary | ICD-10-CM

## 2016-05-11 ENCOUNTER — Ambulatory Visit
Admission: RE | Admit: 2016-05-11 | Discharge: 2016-05-11 | Disposition: A | Discharge status: Home / Self Care | Payer: Medicare Other | Source: Ambulatory Visit | Attending: Gynecology | Admitting: Gynecology

## 2016-05-11 DIAGNOSIS — Z1231 Encounter for screening mammogram for malignant neoplasm of breast: Secondary | ICD-10-CM

## 2016-05-11 DIAGNOSIS — M8580 Other specified disorders of bone density and structure, unspecified site: Secondary | ICD-10-CM

## 2016-05-16 ENCOUNTER — Other Ambulatory Visit: Payer: Self-pay | Admitting: Gynecology

## 2016-05-16 DIAGNOSIS — R928 Other abnormal and inconclusive findings on diagnostic imaging of breast: Secondary | ICD-10-CM

## 2016-05-23 ENCOUNTER — Other Ambulatory Visit: Payer: Self-pay | Admitting: Gynecology

## 2016-05-23 ENCOUNTER — Ambulatory Visit
Admission: RE | Admit: 2016-05-23 | Discharge: 2016-05-23 | Disposition: A | Discharge status: Home / Self Care | Payer: Medicare Other | Source: Ambulatory Visit | Attending: Gynecology | Admitting: Gynecology

## 2016-05-23 DIAGNOSIS — R928 Other abnormal and inconclusive findings on diagnostic imaging of breast: Secondary | ICD-10-CM

## 2016-06-27 ENCOUNTER — Ambulatory Visit (INDEPENDENT_AMBULATORY_CARE_PROVIDER_SITE_OTHER): Payer: Medicare Other | Admitting: Podiatry

## 2016-06-27 ENCOUNTER — Encounter: Payer: Self-pay | Admitting: Podiatry

## 2016-06-27 ENCOUNTER — Ambulatory Visit (INDEPENDENT_AMBULATORY_CARE_PROVIDER_SITE_OTHER): Payer: Medicare Other

## 2016-06-27 DIAGNOSIS — M79605 Pain in left leg: Secondary | ICD-10-CM

## 2016-06-27 DIAGNOSIS — D492 Neoplasm of unspecified behavior of bone, soft tissue, and skin: Secondary | ICD-10-CM | POA: Diagnosis not present

## 2016-06-27 DIAGNOSIS — M79676 Pain in unspecified toe(s): Secondary | ICD-10-CM | POA: Diagnosis not present

## 2016-06-27 DIAGNOSIS — M79604 Pain in right leg: Secondary | ICD-10-CM

## 2016-06-27 DIAGNOSIS — B351 Tinea unguium: Secondary | ICD-10-CM

## 2016-06-27 DIAGNOSIS — M79674 Pain in right toe(s): Secondary | ICD-10-CM

## 2016-06-28 NOTE — Progress Notes (Signed)
Subjective:     Patient ID: Tamara Anthony, female   DOB: November 26, 1940, 76 y.o.   MRN: [id number]  HPI patient presents with significant nail disease 1-5 both feet thick incurvated and sore and she cannot cut and a mass underneath the right hallux that she has noticed recently and is not sure as to when it occurred   Review of Systems     Objective:   Physical Exam Neurovascular status intact with thick yellow brittle nailbeds 1-5 both feet and mass plantar aspect right foot underneath the right first MPJ that's freely movable within subcutaneous tissue    Assessment:     Probable mass that's related to either congealed hematoma possible ganglionic or fibrous mass that appears to be well encapsulated with nail disease that are thick painful 1-5 both feet    Plan:     Discussed both conditions and at this point do not recommend excision of mass less were to grow in size change color or become painful. Debrided nailbeds 1-5 both feet which will be done routinely

## 2016-09-26 ENCOUNTER — Encounter: Payer: Self-pay | Admitting: Gynecology

## 2016-11-19 ENCOUNTER — Other Ambulatory Visit: Payer: Self-pay | Admitting: Gynecology

## 2016-11-19 DIAGNOSIS — R921 Mammographic calcification found on diagnostic imaging of breast: Secondary | ICD-10-CM

## 2016-11-19 DIAGNOSIS — N6320 Unspecified lump in the left breast, unspecified quadrant: Secondary | ICD-10-CM

## 2016-11-27 ENCOUNTER — Ambulatory Visit
Admission: RE | Admit: 2016-11-27 | Discharge: 2016-11-27 | Disposition: A | Discharge status: Home / Self Care | Payer: Medicare Other | Source: Ambulatory Visit | Attending: Gynecology | Admitting: Gynecology

## 2016-11-27 ENCOUNTER — Ambulatory Visit: Admission: RE | Admit: 2016-11-27 | Payer: Medicare Other | Source: Ambulatory Visit

## 2016-11-27 DIAGNOSIS — R921 Mammographic calcification found on diagnostic imaging of breast: Secondary | ICD-10-CM

## 2016-11-27 DIAGNOSIS — N6320 Unspecified lump in the left breast, unspecified quadrant: Secondary | ICD-10-CM

## 2017-03-10 ENCOUNTER — Ambulatory Visit: Payer: Self-pay | Admitting: Orthopedic Surgery

## 2017-03-15 ENCOUNTER — Ambulatory Visit (INDEPENDENT_AMBULATORY_CARE_PROVIDER_SITE_OTHER): Payer: Medicare Other | Admitting: Obstetrics & Gynecology

## 2017-03-15 ENCOUNTER — Encounter: Payer: Self-pay | Admitting: Obstetrics & Gynecology

## 2017-03-15 VITALS — BP 118/72 | Ht 65.0 in | Wt 182.2 lb

## 2017-03-15 DIAGNOSIS — N6320 Unspecified lump in the left breast, unspecified quadrant: Secondary | ICD-10-CM | POA: Diagnosis not present

## 2017-03-15 DIAGNOSIS — Z01411 Encounter for gynecological examination (general) (routine) with abnormal findings: Secondary | ICD-10-CM | POA: Diagnosis not present

## 2017-03-15 DIAGNOSIS — Z90710 Acquired absence of both cervix and uterus: Secondary | ICD-10-CM

## 2017-03-15 DIAGNOSIS — Z780 Asymptomatic menopausal state: Secondary | ICD-10-CM

## 2017-03-15 NOTE — Progress Notes (Signed)
Edithe Dobbin 02/19/1941 [id number]   History:    76 y.o. G2P2   RP:  Established patient presenting for annual gyn exam   HPI:  S/P Total vaginal Hysterectomy/Anterior repair.  Menopause.  No HRT.  Not sexually active.  Breasts wnl.  Mictions/BMs wnl.  Past medical history,surgical history, family history and social history were all reviewed and documented in the EPIC chart.  Gynecologic History No LMP recorded. Patient has had a hysterectomy. Contraception: status post hysterectomy Last Pap: No recent pap. Last mammogram: 05/2016, and Breast US 11/2016. Results were: Benign Colono 2015 Bone Density 04/2016 Normal  Obstetric History OB History  Gravida Para Term Preterm AB Living  2 2 2     2   SAB TAB Ectopic Multiple Live Births          2    # Outcome Date GA Lbr Len/2nd Weight Sex Delivery Anes PTL Lv  2 Term     F Vag-Spont  N LIV  1 Term     F Vag-Spont  N LIV       ROS: A ROS was performed and pertinent positives and negatives are included in the history.  GENERAL: No fevers or chills. HEENT: No change in vision, no earache, sore throat or sinus congestion. NECK: No pain or stiffness. CARDIOVASCULAR: No chest pain or pressure. No palpitations. PULMONARY: No shortness of breath, cough or wheeze. GASTROINTESTINAL: No abdominal pain, nausea, vomiting or diarrhea, melena or bright red blood per rectum. GENITOURINARY: No urinary frequency, urgency, hesitancy or dysuria. MUSCULOSKELETAL: No joint or muscle pain, no back pain, no recent trauma. DERMATOLOGIC: No rash, no itching, no lesions. ENDOCRINE: No polyuria, polydipsia, no heat or cold intolerance. No recent change in weight. HEMATOLOGICAL: No anemia or easy bruising or bleeding. NEUROLOGIC: No headache, seizures, numbness, tingling or weakness. PSYCHIATRIC: No depression, no loss of interest in normal activity or change in sleep pattern.     Exam:   BP 118/72   Ht 5\' 5"  (1.651 m)   Wt 182 lb 3.2 oz (82.6 kg)   BMI 30.32  kg/m   Body mass index is 30.32 kg/m.  General appearance : Well developed well nourished female. No acute distress HEENT: Eyes: no retinal hemorrhage or exudates,  Neck supple, trachea midline, no carotid bruits, no thyroidmegaly Lungs: Clear to auscultation, no rhonchi or wheezes, or rib retractions  Heart: Regular rate and rhythm, no murmurs or gallops Breast:Examined in sitting and supine position were symmetrical in appearance.  Right breast: no palpable masses or tenderness,  no skin retraction, no nipple inversion, no nipple discharge, no skin discoloration, no axillary or supraclavicular lymphadenopathy.  Left breast:  Lower external quadrant at 4 O'clock 1 x 3 cm elongated dense area.  No skin change.  No nipple d/c.  No left axillary LN felt. Abdomen: no palpable masses or tenderness, no rebound or guarding Extremities: no edema or skin discoloration or tenderness  Pelvic: Vulva normal  Bartholin, Urethra, Skene Glands: Within normal limits             Vagina: No gross lesions or discharge.  Pap reflex done.  Cervix/Uterus absent  Adnexa  Without masses or tenderness  Anus and perineum  normal    Assessment/Plan:  76 y.o. female for annual exam   1. Encounter for gynecological examination with abnormal finding Gynecologic exam status post hysterectomy.  Pap test reflex done.  Breasts as described below. - Pap IG w/ reflex to HPV when ASC-U  2.  Menopause present No hormone replacement therapy.  Menopause well-tolerated.  Not sexually active.  Status post hysterectomy.  Vitamin D supplements.  Calcium in diet.  Weightbearing physical activity.  Recent bone density April 2018 was normal.  3. Hx of total hysterectomy   4. Mass of left breast Left breast elongated mass at 4:00 1 x 3 cm.  Left diagnostic mammogram with left breast ultrasound requested.  Counseling on above issues >50% x 15 minutes  Princess Bruins MD, 3:21 PM 03/15/2017

## 2017-03-17 ENCOUNTER — Encounter: Payer: Self-pay | Admitting: Obstetrics & Gynecology

## 2017-03-17 NOTE — Patient Instructions (Signed)
1. Encounter for gynecological examination with abnormal finding Gynecologic exam status post hysterectomy.  Pap test reflex done.  Breasts as described below. - Pap IG w/ reflex to HPV when ASC-U  2. Menopause present No hormone replacement therapy.  Menopause well-tolerated.  Not sexually active.  Status post hysterectomy.  Vitamin D supplements.  Calcium in diet.  Weightbearing physical activity.  Recent bone density April 2018 was normal.  3. Hx of total hysterectomy   4. Mass of left breast Left breast elongated mass at 4:00 1 x 3 cm.  Left diagnostic mammogram with left breast ultrasound requested.  Artery, it was a pleasure meeting you today.  I will inform you of your results as soon as available.   Health Maintenance for Postmenopausal Women Menopause is a normal process in which your reproductive ability comes to an end. This process happens gradually over a span of months to years, usually between the ages of 42 and 67. Menopause is complete when you have missed 12 consecutive menstrual periods. It is important to talk with your health care provider about some of the most common conditions that affect postmenopausal women, such as heart disease, cancer, and bone loss (osteoporosis). Adopting a healthy lifestyle and getting preventive care can help to promote your health and wellness. Those actions can also lower your chances of developing some of these common conditions. What should I know about menopause? During menopause, you may experience a number of symptoms, such as:  Moderate-to-severe hot flashes.  Night sweats.  Decrease in sex drive.  Mood swings.  Headaches.  Tiredness.  Irritability.  Memory problems.  Insomnia.  Choosing to treat or not to treat menopausal changes is an individual decision that you make with your health care provider. What should I know about hormone replacement therapy and supplements? Hormone therapy products are effective for  treating symptoms that are associated with menopause, such as hot flashes and night sweats. Hormone replacement carries certain risks, especially as you become older. If you are thinking about using estrogen or estrogen with progestin treatments, discuss the benefits and risks with your health care provider. What should I know about heart disease and stroke? Heart disease, heart attack, and stroke become more likely as you age. This may be due, in part, to the hormonal changes that your body experiences during menopause. These can affect how your body processes dietary fats, triglycerides, and cholesterol. Heart attack and stroke are both medical emergencies. There are many things that you can do to help prevent heart disease and stroke:  Have your blood pressure checked at least every 1-2 years. High blood pressure causes heart disease and increases the risk of stroke.  If you are 41-75 years old, ask your health care provider if you should take aspirin to prevent a heart attack or a stroke.  Do not use any tobacco products, including cigarettes, chewing tobacco, or electronic cigarettes. If you need help quitting, ask your health care provider.  It is important to eat a healthy diet and maintain a healthy weight. ? Be sure to include plenty of vegetables, fruits, low-fat dairy products, and lean protein. ? Avoid eating foods that are high in solid fats, added sugars, or salt (sodium).  Get regular exercise. This is one of the most important things that you can do for your health. ? Try to exercise for at least 150 minutes each week. The type of exercise that you do should increase your heart rate and make you sweat. This is known  as moderate-intensity exercise. ? Try to do strengthening exercises at least twice each week. Do these in addition to the moderate-intensity exercise.  Know your numbers.Ask your health care provider to check your cholesterol and your blood glucose. Continue to have  your blood tested as directed by your health care provider.  What should I know about cancer screening? There are several types of cancer. Take the following steps to reduce your risk and to catch any cancer development as early as possible. Breast Cancer  Practice breast self-awareness. ? This means understanding how your breasts normally appear and feel. ? It also means doing regular breast self-exams. Let your health care provider know about any changes, no matter how small.  If you are 39 or older, have a clinician do a breast exam (clinical breast exam or CBE) every year. Depending on your age, family history, and medical history, it may be recommended that you also have a yearly breast X-ray (mammogram).  If you have a family history of breast cancer, talk with your health care provider about genetic screening.  If you are at high risk for breast cancer, talk with your health care provider about having an MRI and a mammogram every year.  Breast cancer (BRCA) gene test is recommended for women who have family members with BRCA-related cancers. Results of the assessment will determine the need for genetic counseling and BRCA1 and for BRCA2 testing. BRCA-related cancers include these types: ? Breast. This occurs in males or females. ? Ovarian. ? Tubal. This may also be called fallopian tube cancer. ? Cancer of the abdominal or pelvic lining (peritoneal cancer). ? Prostate. ? Pancreatic.  Cervical, Uterine, and Ovarian Cancer Your health care provider may recommend that you be screened regularly for cancer of the pelvic organs. These include your ovaries, uterus, and vagina. This screening involves a pelvic exam, which includes checking for microscopic changes to the surface of your cervix (Pap test).  For women ages 21-65, health care providers may recommend a pelvic exam and a Pap test every three years. For women ages 50-65, they may recommend the Pap test and pelvic exam, combined  with testing for human papilloma virus (HPV), every five years. Some types of HPV increase your risk of cervical cancer. Testing for HPV may also be done on women of any age who have unclear Pap test results.  Other health care providers may not recommend any screening for nonpregnant women who are considered low risk for pelvic cancer and have no symptoms. Ask your health care provider if a screening pelvic exam is right for you.  If you have had past treatment for cervical cancer or a condition that could lead to cancer, you need Pap tests and screening for cancer for at least 20 years after your treatment. If Pap tests have been discontinued for you, your risk factors (such as having a new sexual partner) need to be reassessed to determine if you should start having screenings again. Some women have medical problems that increase the chance of getting cervical cancer. In these cases, your health care provider may recommend that you have screening and Pap tests more often.  If you have a family history of uterine cancer or ovarian cancer, talk with your health care provider about genetic screening.  If you have vaginal bleeding after reaching menopause, tell your health care provider.  There are currently no reliable tests available to screen for ovarian cancer.  Lung Cancer Lung cancer screening is recommended for adults 55-80  years old who are at high risk for lung cancer because of a history of smoking. A yearly low-dose CT scan of the lungs is recommended if you:  Currently smoke.  Have a history of at least 30 pack-years of smoking and you currently smoke or have quit within the past 15 years. A pack-year is smoking an average of one pack of cigarettes per day for one year.  Yearly screening should:  Continue until it has been 15 years since you quit.  Stop if you develop a health problem that would prevent you from having lung cancer treatment.  Colorectal Cancer  This type of  cancer can be detected and can often be prevented.  Routine colorectal cancer screening usually begins at age 45 and continues through age 19.  If you have risk factors for colon cancer, your health care provider may recommend that you be screened at an earlier age.  If you have a family history of colorectal cancer, talk with your health care provider about genetic screening.  Your health care provider may also recommend using home test kits to check for hidden blood in your stool.  A small camera at the end of a tube can be used to examine your colon directly (sigmoidoscopy or colonoscopy). This is done to check for the earliest forms of colorectal cancer.  Direct examination of the colon should be repeated every 5-10 years until age 34. However, if early forms of precancerous polyps or small growths are found or if you have a family history or genetic risk for colorectal cancer, you may need to be screened more often.  Skin Cancer  Check your skin from head to toe regularly.  Monitor any moles. Be sure to tell your health care provider: ? About any new moles or changes in moles, especially if there is a change in a mole's shape or color. ? If you have a mole that is larger than the size of a pencil eraser.  If any of your family members has a history of skin cancer, especially at a young age, talk with your health care provider about genetic screening.  Always use sunscreen. Apply sunscreen liberally and repeatedly throughout the day.  Whenever you are outside, protect yourself by wearing long sleeves, pants, a wide-brimmed hat, and sunglasses.  What should I know about osteoporosis? Osteoporosis is a condition in which bone destruction happens more quickly than new bone creation. After menopause, you may be at an increased risk for osteoporosis. To help prevent osteoporosis or the bone fractures that can happen because of osteoporosis, the following is recommended:  If you are  28-96 years old, get at least 1,000 mg of calcium and at least 600 mg of vitamin D per day.  If you are older than age 28 but younger than age 42, get at least 1,200 mg of calcium and at least 600 mg of vitamin D per day.  If you are older than age 65, get at least 1,200 mg of calcium and at least 800 mg of vitamin D per day.  Smoking and excessive alcohol intake increase the risk of osteoporosis. Eat foods that are rich in calcium and vitamin D, and do weight-bearing exercises several times each week as directed by your health care provider. What should I know about how menopause affects my mental health? Depression may occur at any age, but it is more common as you become older. Common symptoms of depression include:  Low or sad mood.  Changes in  sleep patterns.  Changes in appetite or eating patterns.  Feeling an overall lack of motivation or enjoyment of activities that you previously enjoyed.  Frequent crying spells.  Talk with your health care provider if you think that you are experiencing depression. What should I know about immunizations? It is important that you get and maintain your immunizations. These include:  Tetanus, diphtheria, and pertussis (Tdap) booster vaccine.  Influenza every year before the flu season begins.  Pneumonia vaccine.  Shingles vaccine.  Your health care provider may also recommend other immunizations. This information is not intended to replace advice given to you by your health care provider. Make sure you discuss any questions you have with your health care provider. Document Released: 06/22/2005 Document Revised: 11/18/2015 Document Reviewed: 02/01/2015 Elsevier Interactive Patient Education  2018 [employer].

## 2017-03-18 ENCOUNTER — Telehealth: Payer: Self-pay | Admitting: *Deleted

## 2017-03-18 DIAGNOSIS — N6320 Unspecified lump in the left breast, unspecified quadrant: Secondary | ICD-10-CM

## 2017-03-18 NOTE — Telephone Encounter (Signed)
Appointment on 03/21/17 @ 1:20pm pt informed.

## 2017-03-18 NOTE — Telephone Encounter (Signed)
-----   Message from Princess Bruins, MD sent at 03/15/2017  3:37 PM EDT ----- Regarding: Left breast mass for Dx Mammo/US Left breast elongated mass 3 x 1 cm at lower external quadrant, 4 O'clock.

## 2017-03-19 LAB — PAP IG W/ RFLX HPV ASCU

## 2017-03-20 ENCOUNTER — Encounter: Payer: Self-pay | Admitting: Podiatry

## 2017-03-20 ENCOUNTER — Ambulatory Visit (INDEPENDENT_AMBULATORY_CARE_PROVIDER_SITE_OTHER): Payer: Medicare Other | Admitting: Podiatry

## 2017-03-20 DIAGNOSIS — B351 Tinea unguium: Secondary | ICD-10-CM | POA: Diagnosis not present

## 2017-03-20 DIAGNOSIS — E1140 Type 2 diabetes mellitus with diabetic neuropathy, unspecified: Secondary | ICD-10-CM | POA: Diagnosis not present

## 2017-03-20 DIAGNOSIS — M79674 Pain in right toe(s): Secondary | ICD-10-CM

## 2017-03-20 DIAGNOSIS — Q828 Other specified congenital malformations of skin: Secondary | ICD-10-CM | POA: Diagnosis not present

## 2017-03-20 DIAGNOSIS — E1149 Type 2 diabetes mellitus with other diabetic neurological complication: Secondary | ICD-10-CM

## 2017-03-20 DIAGNOSIS — M79675 Pain in left toe(s): Secondary | ICD-10-CM

## 2017-03-20 NOTE — Progress Notes (Signed)
Subjective:    Patient ID: Tamara Anthony, female   DOB: 76 y.o.   MRN: [id number]   HPI patient presents with chronic lesions underneath both feet that are very painful and nail disease with thick incurvated nailbeds that are painful. Patient has diabetes and also is on blood thinner    ROS      Objective:  Physical Exam neurovascular status unchanged with thick yellow brittle nailbeds 1-5 both feet that are painful and lesions plantar aspect both feet that are painful and make shoe gear difficult. Patient has tried wider shoes and other modalities and cannot take care of the nails herself     Assessment:  Chronic nails and lesions with at risk patient with diabetes and on blood thinner     Plan:    Debridement nailbeds 1-5 both feet and lesions bilateral with no iatrogenic bleeding noted

## 2017-03-21 ENCOUNTER — Ambulatory Visit
Admission: RE | Admit: 2017-03-21 | Discharge: 2017-03-21 | Disposition: A | Discharge status: Home / Self Care | Payer: Medicare Other | Source: Ambulatory Visit | Attending: Obstetrics & Gynecology | Admitting: Obstetrics & Gynecology

## 2017-03-21 DIAGNOSIS — N6320 Unspecified lump in the left breast, unspecified quadrant: Secondary | ICD-10-CM

## 2017-03-27 NOTE — Pre-Procedure Instructions (Signed)
The following are in epic: Last office visit Dr. Paulla Dolly 03/20/17 Last office visit Dr. Dellis Filbert 03/15/17  CBCw/diff, CMP, PT/PTT/INR 03/22/17 in patient chart.

## 2017-03-27 NOTE — Patient Instructions (Addendum)
Tamara Anthony  03/27/2017   Your procedure is scheduled on: Wednesday, Nov. 28, 2018   Surgery time: 10:45 AM-12:15PM   Report to [facility]  Entrance    Report to admitting at 8:15 AM   Call this number if you have problems the morning of surgery [phone]   Remember: ONLY 1 PERSON MAY GO WITH YOU TO SHORT STAY TO GET  READY MORNING OF [facility].   Do not eat food or drink liquids :After Midnight.    Take these medicines the morning of surgery with A SIP OF WATER: Carvedilol   DO NOT TAKE ANY DIABETIC MEDICATIONS DAY OF YOUR SURGERY                               You may not have any metal on your body including hair pins, jewelry, and body piercings              Do not wear make-up, lotions, powders, perfumes, or deodorant             Do not wear nail polish.  Do not shave  48 hours prior to surgery.          Do not bring valuables to the [facility]. [facility].   Contacts, dentures or bridgework may not be worn into surgery.   Leave suitcase in the car. After surgery it may be brought to your room.              Please read over the following fact sheets you were given: _____________________________________________________________________             [facility] - Preparing for Surgery Before surgery, you can play an important role.  Because skin is not sterile, your skin needs to be as free of germs as possible.  You can reduce the number of germs on your skin by washing with CHG (chlorahexidine gluconate) soap before surgery.  CHG is an antiseptic cleaner which kills germs and bonds with the skin to continue killing germs even after washing. Please DO NOT use if you have an allergy to CHG or antibacterial soaps.  If your skin becomes reddened/irritated stop using the CHG and inform your nurse when you arrive at Short Stay. Do not shave (including legs and underarms) for at least 48 hours prior  to the first CHG shower.  You may shave your face/neck.  Please follow these instructions carefully:  1.  Shower with CHG Soap the night before surgery and the  morning of surgery.  2.  If you choose to wash your hair, wash your hair first as usual with your normal  shampoo.  3.  After you shampoo, rinse your hair and body thoroughly to remove the shampoo.                             4.  Use CHG as you would any other liquid soap.  You can apply chg directly to the skin and wash.  Gently with a scrungie or clean washcloth.  5.  Apply the CHG Soap to your body ONLY FROM THE NECK DOWN.   Do   not use on face/ open  Wound or open sores. Avoid contact with eyes, ears mouth and   genitals (private parts).                       Wash face,  Genitals (private parts) with your normal soap.             6.  Wash thoroughly, paying special attention to the area where your    surgery  will be performed.  7.  Thoroughly rinse your body with warm water from the neck down.  8.  DO NOT shower/wash with your normal soap after using and rinsing off the CHG Soap.                9.  Pat yourself dry with a clean towel.            10.  Wear clean pajamas.            11.  Place clean sheets on your bed the night of your first shower and do not  sleep with pets. Day of Surgery : Do not apply any lotions/deodorants the morning of surgery.  Please wear clean clothes to the [facility]/surgery center.  FAILURE TO FOLLOW THESE INSTRUCTIONS MAY RESULT IN THE CANCELLATION OF YOUR SURGERY  PATIENT SIGNATURE_________________________________  NURSE SIGNATURE__________________________________  ________________________________________________________________________   [facility]  An incentive spirometer is a tool that can help keep your lungs clear and active. This tool measures how well you are filling your lungs with each breath. Taking long deep breaths may help reverse or decrease the  chance of developing breathing (pulmonary) problems (especially infection) following:  A long period of time when you are unable to move or be active. BEFORE THE PROCEDURE   If the spirometer includes an indicator to show your best effort, your nurse or respiratory therapist will set it to a desired goal.  If possible, sit up straight or lean slightly forward. Try not to slouch.  Hold the incentive spirometer in an upright position. INSTRUCTIONS FOR USE  1. Sit on the edge of your bed if possible, or sit up as far as you can in bed or on a chair. 2. Hold the incentive spirometer in an upright position. 3. Breathe out normally. 4. Place the mouthpiece in your mouth and seal your lips tightly around it. 5. Breathe in slowly and as deeply as possible, raising the piston or the ball toward the top of the column. 6. Hold your breath for 3-5 seconds or for as long as possible. Allow the piston or ball to fall to the bottom of the column. 7. Remove the mouthpiece from your mouth and breathe out normally. 8. Rest for a few seconds and repeat Steps 1 through 7 at least 10 times every 1-2 hours when you are awake. Take your time and take a few normal breaths between deep breaths. 9. The spirometer may include an indicator to show your best effort. Use the indicator as a goal to work toward during each repetition. 10. After each set of 10 deep breaths, practice coughing to be sure your lungs are clear. If you have an incision (the cut made at the time of surgery), support your incision when coughing by placing a pillow or rolled up towels firmly against it. Once you are able to get out of bed, walk around indoors and cough well. You may stop using the incentive spirometer when instructed by your caregiver.  RISKS AND COMPLICATIONS  Take your time  so you do not get dizzy or light-headed.  If you are in pain, you may need to take or ask for pain medication before doing incentive spirometry. It is harder  to take a deep breath if you are having pain. AFTER USE  Rest and breathe slowly and easily.  It can be helpful to keep track of a log of your progress. Your caregiver can provide you with a simple table to help with this. If you are using the spirometer at home, follow these instructions: [name] IF:   You are having difficultly using the spirometer.  You have trouble using the spirometer as often as instructed.  Your pain medication is not giving enough relief while using the spirometer.  You develop fever of 100.5 F (38.1 C) or higher. SEEK IMMEDIATE MEDICAL CARE IF:   You cough up bloody sputum that had not been present before.  You develop fever of 102 F (38.9 C) or greater.  You develop worsening pain at or near the incision site. MAKE SURE YOU:   Understand these instructions.  Will watch your condition.  Will get help right away if you are not doing well or get worse. Document Released: 09/10/2006 Document Revised: 07/23/2011 Document Reviewed: 11/11/2006 Exit[employer] Patient Information 2014 Exit[employer], [place].   ________________________________________________________________________  WHAT IS A BLOOD TRANSFUSION? Blood Transfusion Information  A transfusion is the replacement of blood or some of its parts. Blood is made up of multiple cells which provide different functions.  Red blood cells carry oxygen and are used for blood loss replacement.  White blood cells fight against infection.  Platelets control bleeding.  Plasma helps clot blood.  Other blood products are available for specialized needs, such as hemophilia or other clotting disorders. BEFORE THE TRANSFUSION  Who gives blood for transfusions?   Healthy volunteers who are fully evaluated to make sure their blood is safe. This is blood bank blood. Transfusion therapy is the safest it has ever been in the practice of medicine. Before blood is taken from a donor, a complete history is taken  to make sure that person has no history of diseases nor engages in risky social behavior (examples are intravenous drug use or sexual activity with multiple partners). The donor's travel history is screened to minimize risk of transmitting infections, such as malaria. The donated blood is tested for signs of infectious diseases, such as HIV and hepatitis. The blood is then tested to be sure it is compatible with you in order to minimize the chance of a transfusion reaction. If you or a relative donates blood, this is often done in anticipation of surgery and is not appropriate for emergency situations. It takes many days to process the donated blood. RISKS AND COMPLICATIONS Although transfusion therapy is very safe and saves many lives, the main dangers of transfusion include:   Getting an infectious disease.  Developing a transfusion reaction. This is an allergic reaction to something in the blood you were given. Every precaution is taken to prevent this. The decision to have a blood transfusion has been considered carefully by your caregiver before blood is given. Blood is not given unless the benefits outweigh the risks. AFTER THE TRANSFUSION  Right after receiving a blood transfusion, you will usually feel much better and more energetic. This is especially true if your red blood cells have gotten low (anemic). The transfusion raises the level of the red blood cells which carry oxygen, and this usually causes an energy increase.  The  nurse administering the transfusion will monitor you carefully for complications. HOME CARE INSTRUCTIONS  No special instructions are needed after a transfusion. You may find your energy is better. Speak with your caregiver about any limitations on activity for underlying diseases you may have. SEEK MEDICAL CARE IF:   Your condition is not improving after your transfusion.  You develop redness or irritation at the intravenous (IV) site. SEEK IMMEDIATE MEDICAL CARE  IF:  Any of the following symptoms occur over the next 12 hours:  Shaking chills.  You have a temperature by mouth above 102 F (38.9 C), not controlled by medicine.  Chest, back, or muscle pain.  People around you feel you are not acting correctly or are confused.  Shortness of breath or difficulty breathing.  Dizziness and fainting.  You get a rash or develop hives.  You have a decrease in urine output.  Your urine turns a dark color or changes to pink, red, or brown. Any of the following symptoms occur over the next 10 days:  You have a temperature by mouth above 102 F (38.9 C), not controlled by medicine.  Shortness of breath.  Weakness after normal activity.  The white part of the eye turns yellow (jaundice).  You have a decrease in the amount of urine or are urinating less often.  Your urine turns a dark color or changes to pink, red, or brown. Document Released: 04/27/2000 Document Revised: 07/23/2011 Document Reviewed: 12/15/2007 [facility] Patient Information 2014 [place], [place].  _______________________________________________________________________

## 2017-03-30 ENCOUNTER — Ambulatory Visit: Payer: Self-pay | Admitting: Orthopedic Surgery

## 2017-03-30 NOTE — H&P (View-Only) (Signed)
Tamara Anthony DOB: [birth year] Married / Language: English / Race: White Female Date of admission: April 10, 2017  Chief complaint: Left hip pain and right knee pain History of Present Illness  The patient is a 76 year old female who comes in  for a preoperative History and Physical. The patient is scheduled for a left total hip arthroplasty (anterior) and a right knee cortisone injection to be performed by Dr. Dione Plover. Aluisio, MD at [facility] on 04/10/2017. The patient is a 76 year old female who presented for follow up of their hip. The patient is being followed for their left hip pain and osteoarthritis. Symptoms reported include: pain, aching, stiffness, difficulty ambulating and difficulty arising from chair. The patient feels that they are doing poorly and report their pain level to be mild to moderate. Current treatment includes: activity modification. The following medication has been used for pain control: antiinflammatory medication (Aleve). She states that her left hip is getting progressively worse over time. This is really limiting what she can and cannot two. Pain is mainly with activity, but she is getting some discomfort at rest. She feels like the hip is starting to prevent her from doing things that she desires. She would like to get the hip fixed at this time. She has already had the left knee replaced but continues to have some soreness with the right knee. She would like to get a shot in it at time of surgery to help out during her rehab of the left hip. They have been treated conservatively in the past for the above stated problem and despite conservative measures, they continue to have progressive pain and severe functional limitations and dysfunction. They have failed non-operative management including home exercise, medications. It is felt that they would benefit from undergoing total joint replacement. Risks and benefits of the procedure have been discussed with the  patient and they elect to proceed with surgery. There are no active contraindications to surgery such as ongoing infection or rapidly progressive neurological disease.  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 Depression  Mother. mother Cerebrovascular Accident  Mother. mother Heart Disease  Mother. Heart disease in female family member before age 62  Hypertension  Father. Osteoarthritis  Father.  Social History  Current work status  retired Not under pain contract  No history of drug/alcohol rehab  Alcohol use  Occasional alcohol use. Children  2 Exercise  Exercises weekly; does individual sport and gym / weights Exercises weekly Living situation  live with spouse Current drinker  02/10/2014: Currently drinks beer, wine and hard liquor only occasionally per week Tobacco use  Former smoker, Never smoker. 02/10/2014: smoke(d) less than 1/2 pack(s) per day Marital status  married Number of flights of stairs before winded  4-5 Tobacco / smoke exposure  02/10/2014: no no Post-Surgical Plans  Home with Husband Advance Directives  Living Will, Healthcare POA  Medication History Hydrochlorothiazide (25MG  Tablet, Oral) Active. Coreg CR (40MG  Capsule ER 24HR, Oral) Active. Crestor (10MG  Tablet, Oral) Active. (3 x per week) Quinapril HCl (40MG  Tablet,  Oral) Active. Multivitamin (Oral) Active. Aspirin (81MG  Tablet, Oral) Active. Vitamin B Complex (Oral) Active. Omega  3 (Oral) Specific strength unknown - Active. Melatonin Active. Miralax Active. Stool Softner Active.  Past Surgical History Colon Polyp Removal - Colonoscopy  Tonsillectomy  Date: 1949. Hysterectomy  Date: 03/2012. partial (non-cancerous)     Review of Systems General Not Present- Chills, Fatigue, Fever, Memory Loss, Night Sweats, Weight Gain and Weight Loss. Skin Not Present- Eczema, Hives, Itching, Lesions and Rash. HEENT Not Present- Dentures, Double Vision, Headache, Hearing Loss, Tinnitus and Visual Loss. Respiratory Not Present- Allergies, Chronic Cough, Coughing up blood, Shortness of breath at rest and Shortness of breath with exertion. Cardiovascular Not Present- Chest Pain, Difficulty Breathing Lying Down, Murmur, Palpitations, Racing/skipping heartbeats and Swelling. Gastrointestinal Not Present- Abdominal Pain, Bloody Stool, Constipation, Diarrhea, Difficulty Swallowing, Heartburn, Jaundice, Loss of appetitie, Nausea and Vomiting. Female Genitourinary Present- Urinating at Night. Not Present- Blood in Urine, Discharge, Flank Pain, Incontinence, Painful Urination, Urgency, Urinary frequency, Urinary Retention and Weak urinary stream. Musculoskeletal Present- Joint Pain. Not Present- Back Pain, Joint Swelling, Morning Stiffness, Muscle Pain, Muscle Weakness and Spasms. Neurological Not Present- Blackout spells, Difficulty with balance, Dizziness, Paralysis, Tremor and Weakness. Psychiatric Not Present- Insomnia.  Vitals Weight: 180 lb Height: 65in Weight was reported by patient. Body Surface Area: 1.89 m Body Mass Index: 29.95 kg/m  Pulse: 64 (Regular)  BP: 138/70 (Sitting, Right Arm, Standard)   Physical Exam  General Mental Status -Alert, cooperative and good historian. General Appearance-pleasant, Not in acute distress. Orientation-Oriented X3. Build & Nutrition-Well nourished and Well developed.  Head and Neck Head-normocephalic,  atraumatic . Neck Global Assessment - supple, no bruit auscultated on the right, no bruit auscultated on the left.  Eye Vision-Wears corrective lenses. Pupil - Bilateral-Regular and Round. Motion - Bilateral-EOMI.  ENMT Note: upper and lower dentures   Chest and Lung Exam Auscultation Breath sounds - clear at anterior chest wall and clear at posterior chest wall. Adventitious sounds - No Adventitious sounds.  Cardiovascular Auscultation Rhythm - Regular rate and rhythm. Heart Sounds - S1 WNL and S2 WNL. Murmurs & Other Heart Sounds - Auscultation of the heart reveals - No Murmurs.  Abdomen Palpation/Percussion Tenderness - Abdomen is non-tender to palpation. Rigidity (guarding) - Abdomen is soft. Auscultation Auscultation of the abdomen reveals - Bowel sounds normal.  Female Genitourinary Note: Not done, not pertinent to present illness   Musculoskeletal Note: Her left knee looks great. Range of motion 5 to 130 with no tenderness or instability. Left hip we flex to 100, about 10 internal rotation, 25 external rotation, 30 abduction. Right hip has normal range of motion. Right knee exam is 0 to 135 with slight tenderness or instability. She has a significantly antalgic gait pattern on the left.  IMAGING Radiographs AP pelvis, lateral of left hip and she has bone-on-bone arthritis in that left hip with subchondral cystic formation.   Assessment & Plan Status post total left knee replacement (U72.536) Primary osteoarthritis of right knee (M17.11) Primary osteoarthritis of left hip (M16.12)  Note:Surgical Plans: Left Total Hip Replacement - Anterior Approach and a Right Knee Cortisone.  Disposition: Home, HHPT  PCP: Dr. Jefm Petty  IV TXA  Anesthesia Issues: None  Patient was instructed on what medications to stop prior to surgery.  Signed electronically by Joelene Millin, III PA-C

## 2017-03-30 NOTE — H&P (Signed)
Tamara Anthony DOB: Sep 14, 1940 Married / Language: English / Race: White Female Date of admission: April 10, 2017  Chief complaint: Left hip pain and right knee pain History of Present Illness  The patient is a 76 year old female who comes in  for a preoperative History and Physical. The patient is scheduled for a left total hip arthroplasty (anterior) and a right knee cortisone injection to be performed by Dr. Dione Plover. Aluisio, MD at [facility] [place] on 04/10/2017. The patient is a 76 year old female who presented for follow up of their hip. The patient is being followed for their left hip pain and osteoarthritis. Symptoms reported include: pain, aching, stiffness, difficulty ambulating and difficulty arising from chair. The patient feels that they are doing poorly and report their pain level to be mild to moderate. Current treatment includes: activity modification. The following medication has been used for pain control: antiinflammatory medication (Aleve). She states that her left hip is getting progressively worse over time. This is really limiting what she can and cannot two. Pain is mainly with activity, but she is getting some discomfort at rest. She feels like the hip is starting to prevent her from doing things that she desires. She would like to get the hip fixed at this time. She has already had the left knee replaced but continues to have some soreness with the right knee. She would like to get a shot in it at time of surgery to help out during her rehab of the left hip. They have been treated conservatively in the past for the above stated problem and despite conservative measures, they continue to have progressive pain and severe functional limitations and dysfunction. They have failed non-operative management including home exercise, medications. It is felt that they would benefit from undergoing total joint replacement. Risks and benefits of the procedure have been discussed with the  patient and they elect to proceed with surgery. There are no active contraindications to surgery such as ongoing infection or rapidly progressive neurological disease.  Problem List/Past Medical Aftercare following left knee joint replacement surgery (Z47.1)  Primary osteoarthritis of left knee (M17.12)  Primary osteoarthritis of left hip (M16.12)  Status post total left knee replacement (H37.169)  Shingles  Urinary Tract Infection  Past History Hemorrhoids   Osteoarthritis  Hypercholesterolemia  High blood pressure  Menopause  Diet-Controlled Diabetes Mellitus  Obesity   Allergies  No Known Drug Allergies   Family History  First Degree Relatives  reported Cancer  Father. Depression  Mother. mother Cerebrovascular Accident  Mother. mother Heart Disease  Mother. Heart disease in female family member before age 51  Hypertension  Father. Osteoarthritis  Father.  Social History  Current work status  retired Not under pain contract  No history of drug/alcohol rehab  Alcohol use  Occasional alcohol use. Children  2 Exercise  Exercises weekly; does individual sport and gym / weights Exercises weekly Living situation  live with spouse Current drinker  02/10/2014: Currently drinks beer, wine and hard liquor only occasionally per week Tobacco use  Former smoker, Never smoker. 02/10/2014: smoke(d) less than 1/2 pack(s) per day Marital status  married Number of flights of stairs before winded  4-5 Tobacco / smoke exposure  02/10/2014: no no Post-Surgical Plans  Home with Husband Advance Directives  Living Will, Healthcare POA  Medication History Hydrochlorothiazide (25MG  Tablet, Oral) Active. Coreg CR (40MG  Capsule ER 24HR, Oral) Active. Crestor (10MG  Tablet, Oral) Active. (3 x per week) Quinapril HCl (40MG  Tablet,  Oral) Active. Multivitamin (Oral) Active. Aspirin (81MG  Tablet, Oral) Active. Vitamin B Complex (Oral) Active. Omega  3 (Oral) Specific strength unknown - Active. Melatonin Active. Miralax Active. Stool Softner Active.  Past Surgical History Colon Polyp Removal - Colonoscopy  Tonsillectomy  Date: 1949. Hysterectomy  Date: 03/2012. partial (non-cancerous)     Review of Systems General Not Present- Chills, Fatigue, Fever, Memory Loss, Night Sweats, Weight Gain and Weight Loss. Skin Not Present- Eczema, Hives, Itching, Lesions and Rash. HEENT Not Present- Dentures, Double Vision, Headache, Hearing Loss, Tinnitus and Visual Loss. Respiratory Not Present- Allergies, Chronic Cough, Coughing up blood, Shortness of breath at rest and Shortness of breath with exertion. Cardiovascular Not Present- Chest Pain, Difficulty Breathing Lying Down, Murmur, Palpitations, Racing/skipping heartbeats and Swelling. Gastrointestinal Not Present- Abdominal Pain, Bloody Stool, Constipation, Diarrhea, Difficulty Swallowing, Heartburn, Jaundice, Loss of appetitie, Nausea and Vomiting. Female Genitourinary Present- Urinating at Night. Not Present- Blood in Urine, Discharge, Flank Pain, Incontinence, Painful Urination, Urgency, Urinary frequency, Urinary Retention and Weak urinary stream. Musculoskeletal Present- Joint Pain. Not Present- Back Pain, Joint Swelling, Morning Stiffness, Muscle Pain, Muscle Weakness and Spasms. Neurological Not Present- Blackout spells, Difficulty with balance, Dizziness, Paralysis, Tremor and Weakness. Psychiatric Not Present- Insomnia.  Vitals Weight: 180 lb Height: 65in Weight was reported by patient. Body Surface Area: 1.89 m Body Mass Index: 29.95 kg/m  Pulse: 64 (Regular)  BP: 138/70 (Sitting, Right Arm, Standard)   Physical Exam  General Mental Status -Alert, cooperative and good historian. General Appearance-pleasant, Not in acute distress. Orientation-Oriented X3. Build & Nutrition-Well nourished and Well developed.  Head and Neck Head-normocephalic,  atraumatic . Neck Global Assessment - supple, no bruit auscultated on the right, no bruit auscultated on the left.  Eye Vision-Wears corrective lenses. Pupil - Bilateral-Regular and Round. Motion - Bilateral-EOMI.  ENMT Note: upper and lower dentures   Chest and Lung Exam Auscultation Breath sounds - clear at anterior chest wall and clear at posterior chest wall. Adventitious sounds - No Adventitious sounds.  Cardiovascular Auscultation Rhythm - Regular rate and rhythm. Heart Sounds - S1 WNL and S2 WNL. Murmurs & Other Heart Sounds - Auscultation of the heart reveals - No Murmurs.  Abdomen Palpation/Percussion Tenderness - Abdomen is non-tender to palpation. Rigidity (guarding) - Abdomen is soft. Auscultation Auscultation of the abdomen reveals - Bowel sounds normal.  Female Genitourinary Note: Not done, not pertinent to present illness   Musculoskeletal Note: Her left knee looks great. Range of motion 5 to 130 with no tenderness or instability. Left hip we flex to 100, about 10 internal rotation, 25 external rotation, 30 abduction. Right hip has normal range of motion. Right knee exam is 0 to 135 with slight tenderness or instability. She has a significantly antalgic gait pattern on the left.  IMAGING Radiographs AP pelvis, lateral of left hip and she has bone-on-bone arthritis in that left hip with subchondral cystic formation.   Assessment & Plan Status post total left knee replacement (Y18.563) Primary osteoarthritis of right knee (M17.11) Primary osteoarthritis of left hip (M16.12)  Note:Surgical Plans: Left Total Hip Replacement - Anterior Approach and a Right Knee Cortisone.  Disposition: Home, HHPT  PCP: Dr. Jefm Petty  IV TXA  Anesthesia Issues: None  Patient was instructed on what medications to stop prior to surgery.  Signed electronically by Joelene Millin, III PA-C

## 2017-04-02 ENCOUNTER — Other Ambulatory Visit: Payer: Self-pay

## 2017-04-02 ENCOUNTER — Ambulatory Visit (HOSPITAL_COMMUNITY)
Admission: RE | Admit: 2017-04-02 | Discharge: 2017-04-02 | Disposition: A | Discharge status: Home / Self Care | Payer: Medicare Other | Source: Ambulatory Visit | Attending: Orthopedic Surgery | Admitting: Orthopedic Surgery

## 2017-04-02 ENCOUNTER — Encounter (HOSPITAL_COMMUNITY): Payer: Self-pay

## 2017-04-02 ENCOUNTER — Encounter (HOSPITAL_COMMUNITY)
Admission: RE | Admit: 2017-04-02 | Discharge: 2017-04-02 | Disposition: A | Discharge status: Home / Self Care | Payer: Medicare Other | Source: Ambulatory Visit | Attending: Orthopedic Surgery | Admitting: Orthopedic Surgery

## 2017-04-02 DIAGNOSIS — Z01810 Encounter for preprocedural cardiovascular examination: Secondary | ICD-10-CM | POA: Insufficient documentation

## 2017-04-02 DIAGNOSIS — Z01818 Encounter for other preprocedural examination: Secondary | ICD-10-CM

## 2017-04-02 DIAGNOSIS — M1612 Unilateral primary osteoarthritis, left hip: Secondary | ICD-10-CM | POA: Diagnosis not present

## 2017-04-02 DIAGNOSIS — Z7982 Long term (current) use of aspirin: Secondary | ICD-10-CM | POA: Insufficient documentation

## 2017-04-02 DIAGNOSIS — Z79899 Other long term (current) drug therapy: Secondary | ICD-10-CM | POA: Diagnosis not present

## 2017-04-02 LAB — SURGICAL PCR SCREEN
MRSA, PCR: NEGATIVE
Staphylococcus aureus: NEGATIVE

## 2017-04-02 LAB — HEMOGLOBIN A1C
HEMOGLOBIN A1C: 6 % — AB (ref 4.8–5.6)
Mean Plasma Glucose: 125.5 mg/dL

## 2017-04-02 LAB — GLUCOSE, CAPILLARY: Glucose-Capillary: 94 mg/dL (ref 65–99)

## 2017-04-10 ENCOUNTER — Inpatient Hospital Stay (HOSPITAL_COMMUNITY): Payer: Medicare Other | Admitting: Certified Registered Nurse Anesthetist

## 2017-04-10 ENCOUNTER — Other Ambulatory Visit: Payer: Self-pay

## 2017-04-10 ENCOUNTER — Encounter (HOSPITAL_COMMUNITY)
Admission: RE | Disposition: A | Discharge status: Home Health | Payer: Self-pay | Source: Ambulatory Visit | Attending: Orthopedic Surgery

## 2017-04-10 ENCOUNTER — Inpatient Hospital Stay (HOSPITAL_COMMUNITY): Payer: Medicare Other

## 2017-04-10 ENCOUNTER — Inpatient Hospital Stay (HOSPITAL_COMMUNITY)
Admission: RE | Admit: 2017-04-10 | Discharge: 2017-04-11 | DRG: 470 | Disposition: A | Discharge status: Home Health | Payer: Medicare Other | Source: Ambulatory Visit | Attending: Orthopedic Surgery | Admitting: Orthopedic Surgery

## 2017-04-10 ENCOUNTER — Encounter (HOSPITAL_COMMUNITY): Payer: Self-pay | Admitting: *Deleted

## 2017-04-10 DIAGNOSIS — M1612 Unilateral primary osteoarthritis, left hip: Secondary | ICD-10-CM | POA: Diagnosis present

## 2017-04-10 DIAGNOSIS — Z6830 Body mass index (BMI) 30.0-30.9, adult: Secondary | ICD-10-CM | POA: Diagnosis not present

## 2017-04-10 DIAGNOSIS — I10 Essential (primary) hypertension: Secondary | ICD-10-CM | POA: Diagnosis present

## 2017-04-10 DIAGNOSIS — E119 Type 2 diabetes mellitus without complications: Secondary | ICD-10-CM | POA: Diagnosis present

## 2017-04-10 DIAGNOSIS — Z809 Family history of malignant neoplasm, unspecified: Secondary | ICD-10-CM

## 2017-04-10 DIAGNOSIS — Z823 Family history of stroke: Secondary | ICD-10-CM | POA: Diagnosis not present

## 2017-04-10 DIAGNOSIS — E669 Obesity, unspecified: Secondary | ICD-10-CM | POA: Diagnosis present

## 2017-04-10 DIAGNOSIS — Z86010 Personal history of colonic polyps: Secondary | ICD-10-CM

## 2017-04-10 DIAGNOSIS — Z7982 Long term (current) use of aspirin: Secondary | ICD-10-CM | POA: Diagnosis not present

## 2017-04-10 DIAGNOSIS — Z87891 Personal history of nicotine dependence: Secondary | ICD-10-CM

## 2017-04-10 DIAGNOSIS — Z96652 Presence of left artificial knee joint: Secondary | ICD-10-CM | POA: Diagnosis present

## 2017-04-10 DIAGNOSIS — Z7984 Long term (current) use of oral hypoglycemic drugs: Secondary | ICD-10-CM

## 2017-04-10 DIAGNOSIS — Z90710 Acquired absence of both cervix and uterus: Secondary | ICD-10-CM | POA: Diagnosis not present

## 2017-04-10 DIAGNOSIS — E7800 Pure hypercholesterolemia, unspecified: Secondary | ICD-10-CM | POA: Diagnosis present

## 2017-04-10 DIAGNOSIS — Z818 Family history of other mental and behavioral disorders: Secondary | ICD-10-CM | POA: Diagnosis not present

## 2017-04-10 DIAGNOSIS — M1711 Unilateral primary osteoarthritis, right knee: Secondary | ICD-10-CM | POA: Diagnosis present

## 2017-04-10 DIAGNOSIS — Z8249 Family history of ischemic heart disease and other diseases of the circulatory system: Secondary | ICD-10-CM | POA: Diagnosis not present

## 2017-04-10 DIAGNOSIS — M25552 Pain in left hip: Secondary | ICD-10-CM | POA: Diagnosis present

## 2017-04-10 DIAGNOSIS — Z96649 Presence of unspecified artificial hip joint: Secondary | ICD-10-CM

## 2017-04-10 DIAGNOSIS — M169 Osteoarthritis of hip, unspecified: Secondary | ICD-10-CM | POA: Diagnosis present

## 2017-04-10 HISTORY — PX: TOTAL HIP ARTHROPLASTY: SHX124

## 2017-04-10 LAB — COMPREHENSIVE METABOLIC PANEL
ALT: 15 U/L (ref 14–54)
ANION GAP: 8 (ref 5–15)
AST: 20 U/L (ref 15–41)
Albumin: 4 g/dL (ref 3.5–5.0)
Alkaline Phosphatase: 68 U/L (ref 38–126)
BUN: 15 mg/dL (ref 6–20)
CALCIUM: 9.6 mg/dL (ref 8.9–10.3)
CHLORIDE: 105 mmol/L (ref 101–111)
CO2: 26 mmol/L (ref 22–32)
CREATININE: 0.78 mg/dL (ref 0.44–1.00)
Glucose, Bld: 114 mg/dL — ABNORMAL HIGH (ref 65–99)
Potassium: 3.6 mmol/L (ref 3.5–5.1)
SODIUM: 139 mmol/L (ref 135–145)
Total Bilirubin: 1 mg/dL (ref 0.3–1.2)
Total Protein: 7.1 g/dL (ref 6.5–8.1)

## 2017-04-10 LAB — PROTIME-INR
INR: 0.96
PROTHROMBIN TIME: 12.7 s (ref 11.4–15.2)

## 2017-04-10 LAB — GLUCOSE, CAPILLARY: GLUCOSE-CAPILLARY: 99 mg/dL (ref 65–99)

## 2017-04-10 LAB — CBC
HCT: 40.8 % (ref 36.0–46.0)
Hemoglobin: 13.7 g/dL (ref 12.0–15.0)
MCH: 28.9 pg (ref 26.0–34.0)
MCHC: 33.6 g/dL (ref 30.0–36.0)
MCV: 86.1 fL (ref 78.0–100.0)
PLATELETS: 244 10*3/uL (ref 150–400)
RBC: 4.74 MIL/uL (ref 3.87–5.11)
RDW: 12.9 % (ref 11.5–15.5)
WBC: 6.8 10*3/uL (ref 4.0–10.5)

## 2017-04-10 LAB — TYPE AND SCREEN
ABO/RH(D): A POS
Antibody Screen: NEGATIVE

## 2017-04-10 LAB — APTT: aPTT: 27 seconds (ref 24–36)

## 2017-04-10 SURGERY — ARTHROPLASTY, HIP, TOTAL, ANTERIOR APPROACH
Anesthesia: Monitor Anesthesia Care | Site: Hip | Laterality: Left

## 2017-04-10 MED ORDER — ACETAMINOPHEN 650 MG RE SUPP: 650.0000 mg | RECTAL | Status: DC | PRN

## 2017-04-10 MED ORDER — OXYCODONE HCL 5 MG PO TABS: 5.0000 mg | ORAL_TABLET | ORAL | Status: DC | PRN

## 2017-04-10 MED ORDER — FLEET ENEMA 7-19 GM/118ML RE ENEM: 1.0000 | ENEMA | Freq: Once | RECTAL | Status: DC | PRN

## 2017-04-10 MED ORDER — TRAMADOL HCL 50 MG PO TABS: 50.0000 mg | ORAL_TABLET | Freq: Four times a day (QID) | ORAL | Status: DC | PRN

## 2017-04-10 MED ORDER — HYDROCHLOROTHIAZIDE 25 MG PO TABS
25.0000 mg | ORAL_TABLET | Freq: Every day | ORAL | Status: DC
  Administered 2017-04-11: 09:00:00 25 mg via ORAL
  Filled 2017-04-10: qty 1

## 2017-04-10 MED ORDER — CEFAZOLIN SODIUM-DEXTROSE 2-4 GM/100ML-% IV SOLN
INTRAVENOUS | Status: AC
  Filled 2017-04-10: qty 100

## 2017-04-10 MED ORDER — CARVEDILOL PHOSPHATE ER 20 MG PO CP24
40.0000 mg | ORAL_CAPSULE | Freq: Every morning | ORAL | Status: DC
  Administered 2017-04-11: 40 mg via ORAL
  Filled 2017-04-10: qty 2

## 2017-04-10 MED ORDER — BUPIVACAINE IN DEXTROSE 0.75-8.25 % IT SOLN: INTRATHECAL | Status: DC | PRN

## 2017-04-10 MED ORDER — ACETAMINOPHEN 500 MG PO TABS
1000.0000 mg | ORAL_TABLET | Freq: Four times a day (QID) | ORAL | Status: DC
  Administered 2017-04-10 – 2017-04-11 (×3): 1000 mg via ORAL
  Filled 2017-04-10 (×4): qty 2

## 2017-04-10 MED ORDER — ONDANSETRON HCL 4 MG/2ML IJ SOLN: 4.0000 mg | Freq: Four times a day (QID) | INTRAMUSCULAR | Status: DC | PRN

## 2017-04-10 MED ORDER — FENTANYL CITRATE (PF) 100 MCG/2ML IJ SOLN
INTRAMUSCULAR | Status: DC | PRN
  Administered 2017-04-10 (×2): 50 ug via INTRAVENOUS

## 2017-04-10 MED ORDER — METHOCARBAMOL 1000 MG/10ML IJ SOLN
500.0000 mg | Freq: Four times a day (QID) | INTRAVENOUS | Status: DC | PRN
  Filled 2017-04-10: qty 5

## 2017-04-10 MED ORDER — CEFAZOLIN SODIUM-DEXTROSE 2-4 GM/100ML-% IV SOLN
2.0000 g | Freq: Four times a day (QID) | INTRAVENOUS | Status: AC
  Administered 2017-04-10 (×2): 2 g via INTRAVENOUS
  Filled 2017-04-10 (×2): qty 100

## 2017-04-10 MED ORDER — DIPHENHYDRAMINE HCL 12.5 MG/5ML PO ELIX: 12.5000 mg | ORAL_SOLUTION | ORAL | Status: DC | PRN

## 2017-04-10 MED ORDER — PROPOFOL 500 MG/50ML IV EMUL
INTRAVENOUS | Status: DC | PRN
  Administered 2017-04-10: 50 ug/kg/min via INTRAVENOUS

## 2017-04-10 MED ORDER — FENTANYL CITRATE (PF) 100 MCG/2ML IJ SOLN
INTRAMUSCULAR | Status: AC
  Filled 2017-04-10: qty 2

## 2017-04-10 MED ORDER — PROPOFOL 10 MG/ML IV BOLUS
INTRAVENOUS | Status: AC
  Filled 2017-04-10: qty 60

## 2017-04-10 MED ORDER — RIVAROXABAN 10 MG PO TABS
10.0000 mg | ORAL_TABLET | Freq: Every day | ORAL | Status: DC
  Administered 2017-04-11: 09:00:00 10 mg via ORAL
  Filled 2017-04-10: qty 1

## 2017-04-10 MED ORDER — CHLORHEXIDINE GLUCONATE 4 % EX LIQD: 60.0000 mL | Freq: Once | CUTANEOUS | Status: DC

## 2017-04-10 MED ORDER — STERILE WATER FOR IRRIGATION IR SOLN
Status: DC | PRN
  Administered 2017-04-10: 2000 mL

## 2017-04-10 MED ORDER — MORPHINE SULFATE (PF) 4 MG/ML IV SOLN: 1.0000 mg | INTRAVENOUS | Status: DC | PRN

## 2017-04-10 MED ORDER — DOCUSATE SODIUM 100 MG PO CAPS
100.0000 mg | ORAL_CAPSULE | Freq: Two times a day (BID) | ORAL | Status: DC
  Administered 2017-04-10 – 2017-04-11 (×2): 100 mg via ORAL
  Filled 2017-04-10 (×2): qty 1

## 2017-04-10 MED ORDER — PHENYLEPHRINE HCL 10 MG/ML IJ SOLN
INTRAMUSCULAR | Status: AC
  Filled 2017-04-10: qty 1

## 2017-04-10 MED ORDER — BUPIVACAINE HCL (PF) 0.25 % IJ SOLN
INTRAMUSCULAR | Status: AC
  Filled 2017-04-10: qty 30

## 2017-04-10 MED ORDER — METHOCARBAMOL 500 MG PO TABS: 500.0000 mg | ORAL_TABLET | Freq: Four times a day (QID) | ORAL | Status: DC | PRN

## 2017-04-10 MED ORDER — BUPIVACAINE HCL (PF) 0.25 % IJ SOLN
INTRAMUSCULAR | Status: DC | PRN
  Administered 2017-04-10: 30 mL

## 2017-04-10 MED ORDER — ONDANSETRON HCL 4 MG PO TABS: 4.0000 mg | ORAL_TABLET | Freq: Four times a day (QID) | ORAL | Status: DC | PRN

## 2017-04-10 MED ORDER — 0.9 % SODIUM CHLORIDE (POUR BTL) OPTIME
TOPICAL | Status: DC | PRN
  Administered 2017-04-10: 1000 mL

## 2017-04-10 MED ORDER — ROSUVASTATIN CALCIUM 20 MG PO TABS
20.0000 mg | ORAL_TABLET | Freq: Every day | ORAL | Status: DC
  Administered 2017-04-11: 09:00:00 20 mg via ORAL
  Filled 2017-04-10: qty 1

## 2017-04-10 MED ORDER — CEFAZOLIN SODIUM-DEXTROSE 2-4 GM/100ML-% IV SOLN
2.0000 g | INTRAVENOUS | Status: AC
  Administered 2017-04-10: 2 g via INTRAVENOUS

## 2017-04-10 MED ORDER — METOCLOPRAMIDE HCL 5 MG PO TABS
5.0000 mg | ORAL_TABLET | Freq: Three times a day (TID) | ORAL | Status: DC | PRN
  Administered 2017-04-10: 18:00:00 10 mg via ORAL

## 2017-04-10 MED ORDER — OXYCODONE HCL 5 MG PO TABS
10.0000 mg | ORAL_TABLET | ORAL | Status: DC | PRN
  Administered 2017-04-10 – 2017-04-11 (×3): 10 mg via ORAL
  Filled 2017-04-10 (×4): qty 2

## 2017-04-10 MED ORDER — SODIUM CHLORIDE 0.9 % IV SOLN: INTRAVENOUS | Status: DC

## 2017-04-10 MED ORDER — DEXAMETHASONE SODIUM PHOSPHATE 10 MG/ML IJ SOLN
10.0000 mg | Freq: Once | INTRAMUSCULAR | Status: AC
  Administered 2017-04-10: 10 mg via INTRAVENOUS

## 2017-04-10 MED ORDER — PHENOL 1.4 % MT LIQD: 1.0000 | OROMUCOSAL | Status: DC | PRN

## 2017-04-10 MED ORDER — FENTANYL CITRATE (PF) 100 MCG/2ML IJ SOLN
25.0000 ug | INTRAMUSCULAR | Status: DC | PRN
  Administered 2017-04-10 (×2): 50 ug via INTRAVENOUS

## 2017-04-10 MED ORDER — PROPOFOL 10 MG/ML IV BOLUS
INTRAVENOUS | Status: DC | PRN
  Administered 2017-04-10 (×4): 10 mg via INTRAVENOUS

## 2017-04-10 MED ORDER — PROMETHAZINE HCL 25 MG/ML IJ SOLN: 6.2500 mg | INTRAMUSCULAR | Status: DC | PRN

## 2017-04-10 MED ORDER — MIDAZOLAM HCL 5 MG/5ML IJ SOLN
INTRAMUSCULAR | Status: DC | PRN
  Administered 2017-04-10 (×2): 1 mg via INTRAVENOUS

## 2017-04-10 MED ORDER — BUPIVACAINE IN DEXTROSE 0.75-8.25 % IT SOLN
INTRATHECAL | Status: DC | PRN
  Administered 2017-04-10: 15 mg via INTRATHECAL

## 2017-04-10 MED ORDER — ACETAMINOPHEN 10 MG/ML IV SOLN
1000.0000 mg | Freq: Once | INTRAVENOUS | Status: AC
  Administered 2017-04-10: 1000 mg via INTRAVENOUS

## 2017-04-10 MED ORDER — POLYETHYLENE GLYCOL 3350 17 G PO PACK: 17.0000 g | PACK | Freq: Every day | ORAL | Status: DC | PRN

## 2017-04-10 MED ORDER — BISACODYL 10 MG RE SUPP: 10.0000 mg | Freq: Every day | RECTAL | Status: DC | PRN

## 2017-04-10 MED ORDER — MENTHOL 3 MG MT LOZG: 1.0000 | LOZENGE | OROMUCOSAL | Status: DC | PRN

## 2017-04-10 MED ORDER — METOCLOPRAMIDE HCL 5 MG/ML IJ SOLN
5.0000 mg | Freq: Three times a day (TID) | INTRAMUSCULAR | Status: DC | PRN
  Filled 2017-04-10: qty 2

## 2017-04-10 MED ORDER — LACTATED RINGERS IV SOLN
INTRAVENOUS | Status: DC
  Administered 2017-04-10: 1000 mL via INTRAVENOUS
  Administered 2017-04-10 (×2): via INTRAVENOUS

## 2017-04-10 MED ORDER — ACETAMINOPHEN 325 MG PO TABS: 650.0000 mg | ORAL_TABLET | ORAL | Status: DC | PRN

## 2017-04-10 MED ORDER — ONDANSETRON HCL 4 MG/2ML IJ SOLN
INTRAMUSCULAR | Status: DC | PRN
  Administered 2017-04-10: 4 mg via INTRAVENOUS

## 2017-04-10 MED ORDER — ACETAMINOPHEN 10 MG/ML IV SOLN
INTRAVENOUS | Status: AC
  Filled 2017-04-10: qty 100

## 2017-04-10 MED ORDER — ONDANSETRON HCL 4 MG/2ML IJ SOLN
INTRAMUSCULAR | Status: AC
  Filled 2017-04-10: qty 2

## 2017-04-10 MED ORDER — MIDAZOLAM HCL 2 MG/2ML IJ SOLN
INTRAMUSCULAR | Status: AC
  Filled 2017-04-10: qty 2

## 2017-04-10 MED ORDER — TRANEXAMIC ACID 1000 MG/10ML IV SOLN
1000.0000 mg | INTRAVENOUS | Status: AC
  Administered 2017-04-10: 1000 mg via INTRAVENOUS
  Filled 2017-04-10: qty 1100

## 2017-04-10 MED ORDER — PHENYLEPHRINE HCL 10 MG/ML IJ SOLN
INTRAVENOUS | Status: DC | PRN
  Administered 2017-04-10: 40 ug/min via INTRAVENOUS

## 2017-04-10 MED ORDER — DEXAMETHASONE SODIUM PHOSPHATE 10 MG/ML IJ SOLN
10.0000 mg | Freq: Once | INTRAMUSCULAR | Status: AC
  Administered 2017-04-11: 09:00:00 10 mg via INTRAVENOUS
  Filled 2017-04-10: qty 1

## 2017-04-10 MED ORDER — DEXAMETHASONE SODIUM PHOSPHATE 10 MG/ML IJ SOLN
INTRAMUSCULAR | Status: AC
  Filled 2017-04-10: qty 1

## 2017-04-10 SURGICAL SUPPLY — 41 items
BAG SPEC THK2 15X12 ZIP CLS (MISCELLANEOUS) ×1
BAG ZIPLOCK 12X15 (MISCELLANEOUS) ×2 IMPLANT
BLADE SAG 18X100X1.27 (BLADE) ×3 IMPLANT
CAPT HIP TOTAL 2 ×2 IMPLANT
CLOSURE WOUND 1/2 X4 (GAUZE/BANDAGES/DRESSINGS) ×1
CLOTH BEACON ORANGE TIMEOUT ST (SAFETY) ×3 IMPLANT
COVER PERINEAL POST (MISCELLANEOUS) ×3 IMPLANT
COVER SURGICAL LIGHT HANDLE (MISCELLANEOUS) ×3 IMPLANT
DECANTER SPIKE VIAL GLASS SM (MISCELLANEOUS) ×3 IMPLANT
DRAPE STERI IOBAN 125X83 (DRAPES) ×3 IMPLANT
DRAPE U-SHAPE 47X51 STRL (DRAPES) ×6 IMPLANT
DRSG ADAPTIC 3X8 NADH LF (GAUZE/BANDAGES/DRESSINGS) ×3 IMPLANT
DRSG MEPILEX BORDER 4X4 (GAUZE/BANDAGES/DRESSINGS) ×3 IMPLANT
DRSG MEPILEX BORDER 4X8 (GAUZE/BANDAGES/DRESSINGS) ×3 IMPLANT
DURAPREP 26ML APPLICATOR (WOUND CARE) ×3 IMPLANT
ELECT REM PT RETURN 15FT ADLT (MISCELLANEOUS) ×3 IMPLANT
EVACUATOR 1/8 PVC DRAIN (DRAIN) ×3 IMPLANT
GLOVE BIO SURGEON STRL SZ 6.5 (GLOVE) ×1 IMPLANT
GLOVE BIO SURGEON STRL SZ7.5 (GLOVE) ×3 IMPLANT
GLOVE BIO SURGEON STRL SZ8 (GLOVE) ×6 IMPLANT
GLOVE BIO SURGEONS STRL SZ 6.5 (GLOVE) ×1
GLOVE BIOGEL PI IND STRL 6.5 (GLOVE) IMPLANT
GLOVE BIOGEL PI IND STRL 7.5 (GLOVE) IMPLANT
GLOVE BIOGEL PI IND STRL 8 (GLOVE) ×2 IMPLANT
GLOVE BIOGEL PI INDICATOR 6.5 (GLOVE) ×2
GLOVE BIOGEL PI INDICATOR 7.5 (GLOVE) ×6
GLOVE BIOGEL PI INDICATOR 8 (GLOVE) ×4
GLOVE SURG SS PI 7.0 STRL IVOR (GLOVE) ×2 IMPLANT
GOWN STRL REUS W/TWL LRG LVL3 (GOWN DISPOSABLE) ×3 IMPLANT
GOWN STRL REUS W/TWL XL LVL3 (GOWN DISPOSABLE) ×7 IMPLANT
PACK ANTERIOR HIP CUSTOM (KITS) ×3 IMPLANT
STRIP CLOSURE SKIN 1/2X4 (GAUZE/BANDAGES/DRESSINGS) ×2 IMPLANT
SUT ETHIBOND NAB CT1 #1 30IN (SUTURE) ×3 IMPLANT
SUT MNCRL AB 4-0 PS2 18 (SUTURE) ×3 IMPLANT
SUT STRATAFIX 0 PDS 27 VIOLET (SUTURE) ×3
SUT VIC AB 2-0 CT1 27 (SUTURE) ×6
SUT VIC AB 2-0 CT1 TAPERPNT 27 (SUTURE) ×2 IMPLANT
SUTURE STRATFX 0 PDS 27 VIOLET (SUTURE) ×1 IMPLANT
SYR 50ML LL SCALE MARK (SYRINGE) ×2 IMPLANT
TRAY FOLEY CATH SILVER 14FR (SET/KITS/TRAYS/PACK) ×2 IMPLANT
YANKAUER SUCT BULB TIP 10FT TU (MISCELLANEOUS) ×3 IMPLANT

## 2017-04-10 NOTE — Anesthesia Procedure Notes (Signed)
Spinal  Patient location during procedure: OR Start time: 04/10/2017 9:52 AM End time: 04/10/2017 10:02 AM Staffing Anesthesiologist: Duane Boston, MD Performed: anesthesiologist  Preanesthetic Checklist Completed: patient identified, surgical consent, pre-op evaluation, timeout performed, IV checked, risks and benefits discussed and monitors and equipment checked Spinal Block Patient position: sitting Prep: DuraPrep Patient monitoring: cardiac monitor, continuous pulse ox and blood pressure Approach: midline Location: L2-3 Injection technique: single-shot Needle Needle type: Pencan  Needle gauge: 24 G Needle length: 9 cm Additional Notes Functioning IV was confirmed and monitors were applied. Sterile prep and drape, including hand hygiene and sterile gloves were used. The patient was positioned and the spine was prepped. The skin was anesthetized with lidocaine.  Free flow of clear CSF was obtained prior to injecting local anesthetic into the CSF.  The spinal needle aspirated freely following injection.  The needle was carefully withdrawn.  The patient tolerated the procedure well.

## 2017-04-10 NOTE — Interval H&P Note (Signed)
History and Physical Interval Note:  04/10/2017 8:54 AM  Tamara Anthony  has presented today for surgery, with the diagnosis of Osteoarthritis Left Hip  The various methods of treatment have been discussed with the patient and family. After consideration of risks, benefits and other options for treatment, the patient has consented to  Procedure(s): LEFT TOTAL HIP ARTHROPLASTY ANTERIOR APPROACH (Left) as a surgical intervention .  The patient's history has been reviewed, patient examined, no change in status, stable for surgery.  I have reviewed the patient's chart and labs.  Questions were answered to the patient's satisfaction.     Pilar Plate Sahar Ryback

## 2017-04-10 NOTE — Anesthesia Postprocedure Evaluation (Signed)
Anesthesia Post Note  Patient: Danuta Huseman  Procedure(s) Performed: LEFT TOTAL HIP ARTHROPLASTY ANTERIOR APPROACH (Left Hip)     Patient location during evaluation: PACU Anesthesia Type: MAC Level of consciousness: awake and alert Pain management: pain level controlled Vital Signs Assessment: post-procedure vital signs reviewed and stable Respiratory status: spontaneous breathing and respiratory function stable Cardiovascular status: blood pressure returned to baseline and stable Postop Assessment: spinal receding Anesthetic complications: no    Last Vitals:  Vitals:   04/10/17 1245 04/10/17 1300  BP: (!) 143/72 128/66  Pulse: 66 61  Resp: 15 13  Temp: 36.5 C 36.5 C  SpO2: 100% 100%    Last Pain:  Vitals:   04/10/17 1300  TempSrc:   PainSc: 3     LLE Motor Response: Purposeful movement (04/10/17 1300) LLE Sensation: Decreased (04/10/17 1300) RLE Motor Response: Purposeful movement (04/10/17 1300) RLE Sensation: Decreased (04/10/17 1300) L Sensory Level: L3-Anterior knee, lower leg (04/10/17 1300) R Sensory Level: L3-Anterior knee, lower leg (04/10/17 1300)  Omauri Boeve DANIEL

## 2017-04-10 NOTE — Transfer of Care (Signed)
Immediate Anesthesia Transfer of Care Note  Patient: Tamara Anthony  Procedure(s) Performed: LEFT TOTAL HIP ARTHROPLASTY ANTERIOR APPROACH (Left Hip)  Patient Location: PACU  Anesthesia Type:Spinal  Level of Consciousness: awake, alert  and oriented  Airway & Oxygen Therapy: Patient Spontanous Breathing and Patient connected to face mask oxygen  Post-op Assessment: Report given to RN  Post vital signs: Reviewed and stable  Last Vitals:  Vitals:   04/10/17 0842  BP: (!) 163/82  Pulse: 86  Resp: 18  Temp: 36.8 C  SpO2: 100%    Last Pain:  Vitals:   04/10/17 0842  TempSrc: Oral         Complications: No apparent anesthesia complications

## 2017-04-10 NOTE — Discharge Instructions (Addendum)
° °Dr. Frank Aluisio °Total Joint Specialist °[facility] °[address] °[place], [place] [address] °[phone] ° °ANTERIOR APPROACH TOTAL HIP REPLACEMENT POSTOPERATIVE DIRECTIONS ° ° °Hip Rehabilitation, Guidelines Following Surgery  °The results of a hip operation are greatly improved after range of motion and muscle strengthening exercises. Follow all safety measures which are given to protect your hip. If any of these exercises cause increased pain or swelling in your joint, decrease the amount until you are comfortable again. Then slowly increase the exercises. Call your caregiver if you have problems or questions.  ° °HOME CARE INSTRUCTIONS  °Remove items at home which could result in a fall. This includes throw rugs or furniture in walking pathways.  °· ICE to the affected hip every three hours for 30 minutes at a time and then as needed for pain and swelling.  Continue to use ice on the hip for pain and swelling from surgery. You may notice swelling that will progress down to the foot and ankle.  This is normal after surgery.  Elevate the leg when you are not up walking on it.   °· Continue to use the breathing machine which will help keep your temperature down.  It is common for your temperature to cycle up and down following surgery, especially at night when you are not up moving around and exerting yourself.  The breathing machine keeps your lungs expanded and your temperature down. ° ° °DIET °You may resume your previous home diet once your are discharged from the hospital. ° °DRESSING / WOUND CARE / SHOWERING °You may shower 3 days after surgery, but keep the wounds dry during showering.  You may use an occlusive plastic wrap (Press'n Seal for example), NO SOAKING/SUBMERGING IN THE BATHTUB.  If the bandage gets wet, change with a clean dry gauze.  If the incision gets wet, pat the wound dry with a clean towel. °You may start showering once you are discharged home but do not  submerge the incision under water. Just pat the incision dry and apply a dry gauze dressing on daily. °Change the surgical dressing daily and reapply a dry dressing each time. ° °ACTIVITY °Walk with your walker as instructed. °Use walker as long as suggested by your caregivers. °Avoid periods of inactivity such as sitting longer than an hour when not asleep. This helps prevent blood clots.  °You may resume a sexual relationship in one month or when given the OK by your doctor.  °You may return to work once you are cleared by your doctor.  °Do not drive a car for 6 weeks or until released by you surgeon.  °Do not drive while taking narcotics. ° °WEIGHT BEARING °Weight bearing as tolerated with assist device (walker, cane, etc) as directed, use it as long as suggested by your surgeon or therapist, typically at least 4-6 weeks. ° °POSTOPERATIVE CONSTIPATION PROTOCOL °Constipation - defined medically as fewer than three stools per week and severe constipation as less than one stool per week. ° °One of the most common issues patients have following surgery is constipation.  Even if you have a regular bowel pattern at home, your normal regimen is likely to be disrupted due to multiple reasons following surgery.  Combination of anesthesia, postoperative narcotics, change in appetite and fluid intake all can affect your bowels.  In order to avoid complications following surgery, here are some recommendations in order to help you during your recovery period. ° °Colace (docusate) - Pick up an over-the-counter   form of Colace or another stool softener and take twice a day as long as you are requiring postoperative pain medications.  Take with a full glass of water daily.  If you experience loose stools or diarrhea, hold the colace until you stool forms back up.  If your symptoms do not get better within 1 week or if they get worse, check with your doctor. ° °Dulcolax (bisacodyl) - Pick up over-the-counter and take as directed  by the product packaging as needed to assist with the movement of your bowels.  Take with a full glass of water.  Use this product as needed if not relieved by Colace only.  ° °MiraLax (polyethylene glycol) - Pick up over-the-counter to have on hand.  MiraLax is a solution that will increase the amount of water in your bowels to assist with bowel movements.  Take as directed and can mix with a glass of water, juice, soda, coffee, or tea.  Take if you go more than two days without a movement. °Do not use MiraLax more than once per day. Call your doctor if you are still constipated or irregular after using this medication for 7 days in a row. ° °If you continue to have problems with postoperative constipation, please contact the office for further assistance and recommendations.  If you experience "the worst abdominal pain ever" or develop nausea or vomiting, please contact the office immediatly for further recommendations for treatment. ° °ITCHING ° If you experience itching with your medications, try taking only a single pain pill, or even half a pain pill at a time.  You can also use Benadryl over the counter for itching or also to help with sleep.  ° °TED HOSE STOCKINGS °Wear the elastic stockings on both legs for three weeks following surgery during the day but you may remove then at night for sleeping. ° °MEDICATIONS °See your medication summary on the “After Visit Summary” that the nursing staff will review with you prior to discharge.  You may have some home medications which will be placed on hold until you complete the course of blood thinner medication.  It is important for you to complete the blood thinner medication as prescribed by your surgeon.  Continue your approved medications as instructed at time of discharge. ° °PRECAUTIONS °If you experience chest pain or shortness of breath - call 911 immediately for transfer to the hospital emergency department.  °If you develop a fever greater that 101 F,  purulent drainage from wound, increased redness or drainage from wound, foul odor from the wound/dressing, or calf pain - CONTACT YOUR SURGEON.   °                                                °FOLLOW-UP APPOINTMENTS °Make sure you keep all of your appointments after your operation with your surgeon and caregivers. You should call the office at the above phone number and make an appointment for approximately two weeks after the date of your surgery or on the date instructed by your surgeon outlined in the "After Visit Summary". ° °RANGE OF MOTION AND STRENGTHENING EXERCISES  °These exercises are designed to help you keep full movement of your hip joint. Follow your caregiver's or physical therapist's instructions. Perform all exercises about fifteen times, three times per day or as directed. Exercise both hips, even if you   have had only one joint replacement. These exercises can be done on a training (exercise) mat, on the floor, on a table or on a bed. Use whatever works the best and is most comfortable for you. Use music or television while you are exercising so that the exercises are a pleasant break in your day. This will make your life better with the exercises acting as a break in routine you can look forward to.  °Lying on your back, slowly slide your foot toward your buttocks, raising your knee up off the floor. Then slowly slide your foot back down until your leg is straight again.  °Lying on your back spread your legs as far apart as you can without causing discomfort.  °Lying on your side, raise your upper leg and foot straight up from the floor as far as is comfortable. Slowly lower the leg and repeat.  °Lying on your back, tighten up the muscle in the front of your thigh (quadriceps muscles). You can do this by keeping your leg straight and trying to raise your heel off the floor. This helps strengthen the largest muscle supporting your knee.  °Lying on your back, tighten up the muscles of your  buttocks both with the legs straight and with the knee bent at a comfortable angle while keeping your heel on the floor.  ° °IF YOU ARE TRANSFERRED TO A SKILLED REHAB FACILITY °If the patient is transferred to a skilled rehab facility following release from the hospital, a list of the current medications will be sent to the facility for the patient to continue.  When discharged from the skilled rehab facility, please have the facility set up the patient's Home Health Physical Therapy prior to being released. Also, the skilled facility will be responsible for providing the patient with their medications at time of release from the facility to include their pain medication, the muscle relaxants, and their blood thinner medication. If the patient is still at the rehab facility at time of the two week follow up appointment, the skilled rehab facility will also need to assist the patient in arranging follow up appointment in our office and any transportation needs. ° °MAKE SURE YOU:  °Understand these instructions.  °Get help right away if you are not doing well or get worse.  ° ° °Pick up stool softner and laxative for home use following surgery while on pain medications. °Do not submerge incision under water. °Please use good hand washing techniques while changing dressing each day. °May shower starting three days after surgery. °Please use a clean towel to pat the incision dry following showers. °Continue to use ice for pain and swelling after surgery. °Do not use any lotions or creams on the incision until instructed by your surgeon. ° °Take Xarelto for two and a half more weeks following discharge from the hospital, then discontinue Xarelto. °Once the patient has completed the Xarelto, they may resume the 81 mg Aspirin. ° ° ° °Information on my medicine - XARELTO® (Rivaroxaban) ° °This medication education was reviewed with me or my healthcare representative as part of my discharge preparation.  ° °Why was Xarelto®  prescribed for you? °Xarelto® was prescribed for you to reduce the risk of blood clots forming after orthopedic surgery. The medical term for these abnormal blood clots is venous thromboembolism (VTE). ° °What do you need to know about xarelto® ? °Take your Xarelto® ONCE DAILY at the same time every day. °You may take it either with or without food. ° °  If you have difficulty swallowing the tablet whole, you may crush it and mix in applesauce just prior to taking your dose. ° °Take Xarelto® exactly as prescribed by your doctor and DO NOT stop taking Xarelto® without talking to the doctor who prescribed the medication.  Stopping without other VTE prevention medication to take the place of Xarelto® may increase your risk of developing a clot. ° °After discharge, you should have regular check-up appointments with your healthcare provider that is prescribing your Xarelto®.   ° °What do you do if you miss a dose? °If you miss a dose, take it as soon as you remember on the same day then continue your regularly scheduled once daily regimen the next day. Do not take two doses of Xarelto® on the same day.  ° °Important Safety Information °A possible side effect of Xarelto® is bleeding. You should call your healthcare provider right away if you experience any of the following: °? Bleeding from an injury or your nose that does not stop. °? Unusual colored urine (red or dark brown) or unusual colored stools (red or black). °? Unusual bruising for unknown reasons. °? A serious fall or if you hit your head (even if there is no bleeding). ° °Some medicines may interact with Xarelto® and might increase your risk of bleeding while on Xarelto®. To help avoid this, consult your healthcare provider or pharmacist prior to using any new prescription or non-prescription medications, including herbals, vitamins, non-steroidal anti-inflammatory drugs (NSAIDs) and supplements. ° °This website has more information on Xarelto®:  www.xarelto.com. ° ° °

## 2017-04-10 NOTE — Evaluation (Signed)
Physical Therapy Evaluation Patient Details Name: Tamara Anthony MRN: [id number] DOB: 27-Dec-1940 Today's Date: 04/10/2017   History of Present Illness  Pt s/p L THR and with hx of L TKR (06) and DM  Clinical Impression  Pt s/p L THR and presents with decreased L LE strength/ROM and post op pain limiting functional mobility.  Pt should progress to dc home with family assist.    Follow Up Recommendations DC plan and follow up therapy as arranged by surgeon;Home health PT    Equipment Recommendations  None recommended by PT    Recommendations for Other Services       Precautions / Restrictions Precautions Precautions: Fall Restrictions Weight Bearing Restrictions: No Other Position/Activity Restrictions: WBAT      Mobility  Bed Mobility Overal bed mobility: Needs Assistance Bed Mobility: Supine to Sit     Supine to sit: Min assist     General bed mobility comments: cues for sequence and use of R LE to self assist  Transfers Overall transfer level: Needs assistance Equipment used: Rolling walker (2 wheeled) Transfers: Sit to/from Stand Sit to Stand: Min assist         General transfer comment: cues for LE management and use of UEs to self assist  Ambulation/Gait Ambulation/Gait assistance: Min assist;Min guard Ambulation Distance (Feet): 111 Feet Assistive device: Rolling walker (2 wheeled) Gait Pattern/deviations: Step-to pattern;Step-through pattern;Decreased step length - right;Decreased step length - left;Shuffle;Trunk flexed Gait velocity: decr Gait velocity interpretation: Below normal speed for age/gender General Gait Details: cues for posture, position from RW and initial sequence  Stairs            Wheelchair Mobility    Modified Rankin (Stroke Patients Only)       Balance Overall balance assessment: No apparent balance deficits (not formally assessed)                                           Pertinent Vitals/Pain Pain  Assessment: 0-10 Pain Score: 5  Pain Location: L hip Pain Descriptors / Indicators: Aching;Sore Pain Intervention(s): Limited activity within patient's tolerance;Monitored during session;Premedicated before session;Ice applied    Home Living Family/patient expects to be discharged to:: Private residence Living Arrangements: Spouse/significant other Available Help at Discharge: Family Type of Home: House Home Access: Stairs to enter   Technical brewer of Steps: 1 Home Layout: One level Home Equipment: Environmental consultant - 2 wheels      Prior Function Level of Independence: Independent               Hand Dominance        Extremity/Trunk Assessment   Upper Extremity Assessment Upper Extremity Assessment: Overall WFL for tasks assessed    Lower Extremity Assessment Lower Extremity Assessment: LLE deficits/detail    Cervical / Trunk Assessment Cervical / Trunk Assessment: Normal  Communication   Communication: No difficulties  Cognition Arousal/Alertness: Awake/alert Behavior During Therapy: WFL for tasks assessed/performed Overall Cognitive Status: Within Functional Limits for tasks assessed                                        General Comments      Exercises Total Joint Exercises Ankle Circles/Pumps: AROM;Both;20 reps;Supine   Assessment/Plan    PT Assessment Patient needs continued PT services  PT Problem List  Decreased strength;Decreased range of motion;Decreased activity tolerance;Decreased mobility;Decreased knowledge of use of DME;Pain       PT Treatment Interventions DME instruction;Gait training;Stair training;Functional mobility training;Therapeutic exercise;Therapeutic activities;Patient/family education    PT Goals (Current goals can be found in the Care Plan section)  Acute Rehab PT Goals Patient Stated Goal: Regain IND and resume previous lifestyle PT Goal Formulation: With patient Time For Goal Achievement:  04/15/17 Potential to Achieve Goals: Good    Frequency 7X/week   Barriers to discharge        Co-evaluation               AM-PAC PT "6 Clicks" Daily Activity  Outcome Measure Difficulty turning over in bed (including adjusting bedclothes, sheets and blankets)?: Unable Difficulty moving from lying on back to sitting on the side of the bed? : Unable Difficulty sitting down on and standing up from a chair with arms (e.g., wheelchair, bedside commode, etc,.)?: Unable Help needed moving to and from a bed to chair (including a wheelchair)?: A Little Help needed walking in hospital room?: A Little Help needed climbing 3-5 steps with a railing? : A Little 6 Click Score: 12    End of Session Equipment Utilized During Treatment: Gait belt Activity Tolerance: Patient tolerated treatment well Patient left: in chair;with call bell/phone within reach;with chair alarm set Nurse Communication: Mobility status PT Visit Diagnosis: Difficulty in walking, not elsewhere classified (R26.2)    Time: 4270-6237 PT Time Calculation (min) (ACUTE ONLY): 23 min   Charges:   PT Evaluation $PT Eval Low Complexity: 1 Low PT Treatments $Gait Training: 8-22 mins   PT G Codes:        Pg [phone]   Mertice Uffelman 04/10/2017, 5:09 PM

## 2017-04-10 NOTE — Op Note (Signed)
OPERATIVE REPORT- TOTAL HIP ARTHROPLASTY   PREOPERATIVE DIAGNOSIS: Osteoarthritis of the Left hip.   POSTOPERATIVE DIAGNOSIS: Osteoarthritis of the Left  hip.   PROCEDURE: Left total hip arthroplasty, anterior approach.   SURGEON: Gaynelle Arabian, MD   ASSISTANT: Nurse assist  ANESTHESIA:  Spinal  ESTIMATED BLOOD LOSS:-200 mL    DRAINS: Hemovac x1.   COMPLICATIONS: None   CONDITION: PACU - hemodynamically stable.   BRIEF CLINICAL NOTE: Tamara Anthony is a 76 y.o. female who has advanced end-  stage arthritis of their Left  hip with progressively worsening pain and  dysfunction.The patient has failed nonoperative management and presents for  total hip arthroplasty.   PROCEDURE IN DETAIL: After successful administration of spinal  anesthetic, the traction boots for the [facility] bed were placed on both  feet and the patient was placed onto the [facility] bed, boots placed into the leg  holders. The Left hip was then isolated from the perineum with plastic  drapes and prepped and draped in the usual sterile fashion. ASIS and  greater trochanter were marked and a oblique incision was made, starting  at about 1 cm lateral and 2 cm distal to the ASIS and coursing towards  the anterior cortex of the femur. The skin was cut with a 10 blade  through subcutaneous tissue to the level of the fascia overlying the  tensor fascia lata muscle. The fascia was then incised in line with the  incision at the junction of the anterior third and posterior 2/3rd. The  muscle was teased off the fascia and then the interval between the TFL  and the rectus was developed. The Hohmann retractor was then placed at  the top of the femoral neck over the capsule. The vessels overlying the  capsule were cauterized and the fat on top of the capsule was removed.  A Hohmann retractor was then placed anterior underneath the rectus  femoris to give exposure to the entire anterior capsule. A T-shaped  capsulotomy was  performed. The edges were tagged and the femoral head  was identified.       Osteophytes are removed off the superior acetabulum.  The femoral neck was then cut in situ with an oscillating saw. Traction  was then applied to the left lower extremity utilizing the [facility]  traction. The femoral head was then removed. Retractors were placed  around the acetabulum and then circumferential removal of the labrum was  performed. Osteophytes were also removed. Reaming starts at 45 mm to  medialize and  Increased in 2 mm increments to 49 mm. We reamed in  approximately 40 degrees of abduction, 20 degrees anteversion. A 50 mm  pinnacle acetabular shell was then impacted in anatomic position under  fluoroscopic guidance with excellent purchase. We did not need to place  any additional dome screws. A 32 mm neutral + 4 marathon liner was then  placed into the acetabular shell.       The femoral lift was then placed along the lateral aspect of the femur  just distal to the vastus ridge. The leg was  externally rotated and capsule  was stripped off the inferior aspect of the femoral neck down to the  level of the lesser trochanter, this was done with electrocautery. The femur was lifted after this was performed. The  leg was then placed in an extended and adducted position essentially delivering the femur. We also removed the capsule superiorly and the piriformis from the piriformis fossa to  gain excellent exposure of the  proximal femur. Rongeur was used to remove some cancellous bone to get  into the lateral portion of the proximal femur for placement of the  initial starter reamer. The starter broaches was placed  the starter broach  and was shown to go down the center of the canal. Broaching  with the  Corail system was then performed starting at size 8, coursing  Up to size 11. A size 11 had excellent torsional and rotational  and axial stability. The trial high offset neck was then placed  with a 32 + 1  trial head. The hip was then reduced. We confirmed that  the stem was in the canal both on AP and lateral x-rays. It also has excellent sizing. The hip was reduced with outstanding stability through full extension and full external rotation.. AP pelvis was taken and the leg lengths were measured and found to be equal. Hip was then dislocated again and the femoral head and neck removed. The  femoral broach was removed. Size 11 Corail stem with a high offset  neck was then impacted into the femur following native anteversion. Has  excellent purchase in the canal. Excellent torsional and rotational and  axial stability. It is confirmed to be in the canal on AP and lateral  fluoroscopic views. The 32 +1 ceramic head was placed and the hip  reduced with outstanding stability. Again AP pelvis was taken and it  confirmed that the leg lengths were equal. The wound was then copiously  irrigated with saline solution and the capsule reattached and repaired  with Ethibond suture. 30 ml of .25% Bupivicaine was  injected into the capsule and into the edge of the tensor fascia lata as well as subcutaneous tissue. The fascia overlying the tensor fascia lata was then closed with a running #1 V-Loc. Subcu was closed with interrupted 2-0 Vicryl and subcuticular running 4-0 Monocryl. Incision was cleaned  and dried. Steri-Strips and a bulky sterile dressing applied. Hemovac  drain was hooked to suction and then the patient was awakened and transported to  recovery in stable condition.        Please note that a surgical assistant was a medical necessity for this procedure to perform it in a safe and expeditious manner. Assistant was necessary to provide appropriate retraction of vital neurovascular structures and to prevent femoral fracture and allow for anatomic placement of the prosthesis.  Gaynelle Arabian, M.D.

## 2017-04-10 NOTE — Anesthesia Preprocedure Evaluation (Addendum)
Anesthesia Evaluation  Patient identified by MRN, date of birth, ID band Patient awake    Reviewed: Allergy & Precautions, NPO status , Patient's Chart, lab work & pertinent test results, reviewed documented beta blocker date and time   History of Anesthesia Complications Negative for: history of anesthetic complications  Airway Mallampati: II  TM Distance: >3 FB Neck ROM: Full    Dental no notable dental hx. (+) Lower Dentures, Partial Upper   Pulmonary former smoker,    Pulmonary exam normal breath sounds clear to auscultation       Cardiovascular hypertension, Pt. on medications and Pt. on home beta blockers Normal cardiovascular exam Rhythm:Regular Rate:Normal     Neuro/Psych negative neurological ROS  negative psych ROS   GI/Hepatic negative GI ROS, Neg liver ROS,   Endo/Other  diabetes, Type 2  Renal/GU negative Renal ROS  negative genitourinary   Musculoskeletal negative musculoskeletal ROS (+)   Abdominal   Peds negative pediatric ROS (+)  Hematology negative hematology ROS (+)   Anesthesia Other Findings   Reproductive/Obstetrics negative OB ROS                            Anesthesia Physical  Anesthesia Plan  ASA: III  Anesthesia Plan: Spinal and MAC   Post-op Pain Management:    Induction:   PONV Risk Score and Plan: 2 and Ondansetron and Propofol infusion  Airway Management Planned: Simple Face Mask and Natural Airway  Additional Equipment:   Intra-op Plan:   Post-operative Plan:   Informed Consent: I have reviewed the patients History and Physical, chart, labs and discussed the procedure including the risks, benefits and alternatives for the proposed anesthesia with the patient or authorized representative who has indicated his/her understanding and acceptance.   Dental advisory given  Plan Discussed with: CRNA, Anesthesiologist and Surgeon  Anesthesia  Plan Comments:        Anesthesia Quick Evaluation                                  Anesthesia Evaluation  Patient identified by MRN, date of birth, ID band Patient awake    Reviewed: Allergy & Precautions, H&P , NPO status , Patient's Chart, lab work & pertinent test results, reviewed documented beta blocker date and time   Airway Mallampati: II TM Distance: >3 FB Neck ROM: Full    Dental  (+) Lower Dentures and Partial Upper   Pulmonary neg pulmonary ROS,  breath sounds clear to auscultation  Pulmonary exam normal       Cardiovascular hypertension, Pt. on medications and Pt. on home beta blockers Rhythm:Regular Rate:Normal     Neuro/Psych  Neuromuscular disease negative psych ROS   GI/Hepatic negative GI ROS, Neg liver ROS,   Endo/Other  diabetes, Well Controlled, Type 2, Oral Hypoglycemic Agents  Renal/GU negative Renal ROS   Cystocele Rectocele Uterine Prolapse    Musculoskeletal negative musculoskeletal ROS (+)   Abdominal   Peds  Hematology negative hematology ROS (+)   Anesthesia Other Findings   Reproductive/Obstetrics negative OB ROS                         Anesthesia Physical Anesthesia Plan  ASA: II  Anesthesia Plan: Spinal and Combined Spinal and Epidural   Post-op Pain Management:    Induction: Intravenous  Airway Management Planned: Oral ETT  Additional Equipment:   Intra-op Plan:   Post-operative Plan:   Informed Consent: I have reviewed the patients History and Physical, chart, labs and discussed the procedure including the risks, benefits and alternatives for the proposed anesthesia with the patient or authorized representative who has indicated his/her understanding and acceptance.   Dental advisory given  Plan Discussed with: Anesthesiologist, CRNA and Surgeon  Anesthesia Plan Comments:        Anesthesia Quick Evaluation

## 2017-04-11 LAB — CBC
HCT: 36.9 % (ref 36.0–46.0)
HEMOGLOBIN: 12.5 g/dL (ref 12.0–15.0)
MCH: 29.3 pg (ref 26.0–34.0)
MCHC: 33.9 g/dL (ref 30.0–36.0)
MCV: 86.4 fL (ref 78.0–100.0)
Platelets: 208 10*3/uL (ref 150–400)
RBC: 4.27 MIL/uL (ref 3.87–5.11)
RDW: 12.9 % (ref 11.5–15.5)
WBC: 12.9 10*3/uL — ABNORMAL HIGH (ref 4.0–10.5)

## 2017-04-11 LAB — BASIC METABOLIC PANEL
Anion gap: 6 (ref 5–15)
BUN: 11 mg/dL (ref 6–20)
CHLORIDE: 109 mmol/L (ref 101–111)
CO2: 27 mmol/L (ref 22–32)
CREATININE: 0.61 mg/dL (ref 0.44–1.00)
Calcium: 9.2 mg/dL (ref 8.9–10.3)
GFR calc Af Amer: >60 mL/min (ref >60)
GFR calc non Af Amer: >60 mL/min (ref >60)
GLUCOSE: 116 mg/dL — AB (ref 65–99)
Potassium: 4 mmol/L (ref 3.5–5.1)
SODIUM: 142 mmol/L (ref 135–145)

## 2017-04-11 MED ORDER — OXYCODONE HCL 5 MG PO TABS: 5.0000 mg | ORAL_TABLET | ORAL | 0 refills | Status: DC | PRN

## 2017-04-11 MED ORDER — TRAMADOL HCL 50 MG PO TABS: 50.0000 mg | ORAL_TABLET | Freq: Four times a day (QID) | ORAL | 0 refills | Status: DC | PRN

## 2017-04-11 MED ORDER — RIVAROXABAN 10 MG PO TABS: 10.0000 mg | ORAL_TABLET | Freq: Every day | ORAL | 0 refills | Status: DC

## 2017-04-11 MED ORDER — TRAMADOL HCL 50 MG PO TABS: 50.0000 mg | ORAL_TABLET | Freq: Four times a day (QID) | ORAL | Status: DC | PRN

## 2017-04-11 MED ORDER — METHOCARBAMOL 500 MG PO TABS: 500.0000 mg | ORAL_TABLET | Freq: Four times a day (QID) | ORAL | 0 refills | Status: DC | PRN

## 2017-04-11 NOTE — Discharge Summary (Signed)
Physician Discharge Summary   Patient ID: Tamara Anthony MRN: [id number] DOB/AGE: 1940-10-17 76 y.o.  Admit date: 04/10/2017 Discharge date: 04-11-2017  Primary Diagnosis:  Osteoarthritis of the Left  hip.    Admission Diagnoses:  Past Medical History:  Diagnosis Date  . Arthritis   . Colon polyps   . Diabetes mellitus    type 2  . Hyperlipidemia   . Hypertension   . S/P vaginal hysterectomy    03/2012   Discharge Diagnoses:   Principal Problem:   OA (osteoarthritis) of hip  Estimated body mass index is 30.29 kg/m as calculated from the following:   Height as of this encounter: 5' 5"  (1.651 m).   Weight as of this encounter: 82.6 kg (182 lb).  Procedure(s) (LRB): LEFT TOTAL HIP ARTHROPLASTY ANTERIOR APPROACH (Left)   Consults: None  HPI: Tamara Anthony is a 76 y.o. female who has advanced end-  stage arthritis of their Left  hip with progressively worsening pain and  dysfunction.The patient has failed nonoperative management and presents for  total hip arthroplasty.    Laboratory Data: Admission on 04/10/2017  Component Date Value Ref Range Status  . aPTT 04/10/2017 27  24 - 36 seconds Final  . WBC 04/10/2017 6.8  4.0 - 10.5 K/uL Final  . RBC 04/10/2017 4.74  3.87 - 5.11 MIL/uL Final  . Hemoglobin 04/10/2017 13.7  12.0 - 15.0 g/dL Final  . HCT 04/10/2017 40.8  36.0 - 46.0 % Final  . MCV 04/10/2017 86.1  78.0 - 100.0 fL Final  . MCH 04/10/2017 28.9  26.0 - 34.0 pg Final  . MCHC 04/10/2017 33.6  30.0 - 36.0 g/dL Final  . RDW 04/10/2017 12.9  11.5 - 15.5 % Final  . Platelets 04/10/2017 244  150 - 400 K/uL Final  . Sodium 04/10/2017 139  135 - 145 mmol/L Final  . Potassium 04/10/2017 3.6  3.5 - 5.1 mmol/L Final  . Chloride 04/10/2017 105  101 - 111 mmol/L Final  . CO2 04/10/2017 26  22 - 32 mmol/L Final  . Glucose, Bld 04/10/2017 114* 65 - 99 mg/dL Final  . BUN 04/10/2017 15  6 - 20 mg/dL Final  . Creatinine, Ser 04/10/2017 0.78  0.44 - 1.00 mg/dL Final  . Calcium  04/10/2017 9.6  8.9 - 10.3 mg/dL Final  . Total Protein 04/10/2017 7.1  6.5 - 8.1 g/dL Final  . Albumin 04/10/2017 4.0  3.5 - 5.0 g/dL Final  . AST 04/10/2017 20  15 - 41 U/L Final  . ALT 04/10/2017 15  14 - 54 U/L Final  . Alkaline Phosphatase 04/10/2017 68  38 - 126 U/L Final  . Total Bilirubin 04/10/2017 1.0  0.3 - 1.2 mg/dL Final  . GFR calc non Af Amer 04/10/2017 >60  >60 mL/min Final  . GFR calc Af Amer 04/10/2017 >60  >60 mL/min Final   Comment: (NOTE) The eGFR has been calculated using the CKD EPI equation. This calculation has not been validated in all clinical situations. eGFR's persistently <60 mL/min signify possible Chronic Kidney Disease.   . Anion gap 04/10/2017 8  5 - 15 Final  . Prothrombin Time 04/10/2017 12.7  11.4 - 15.2 seconds Final  . INR 04/10/2017 0.96   Final  . Glucose-Capillary 04/10/2017 99  65 - 99 mg/dL Final  . Comment 1 04/10/2017 Notify RN   Final  . Comment 2 04/10/2017 Document in Chart   Final  . WBC 04/11/2017 12.9* 4.0 - 10.5 K/uL Final  .  RBC 04/11/2017 4.27  3.87 - 5.11 MIL/uL Final  . Hemoglobin 04/11/2017 12.5  12.0 - 15.0 g/dL Final  . HCT 04/11/2017 36.9  36.0 - 46.0 % Final  . MCV 04/11/2017 86.4  78.0 - 100.0 fL Final  . MCH 04/11/2017 29.3  26.0 - 34.0 pg Final  . MCHC 04/11/2017 33.9  30.0 - 36.0 g/dL Final  . RDW 04/11/2017 12.9  11.5 - 15.5 % Final  . Platelets 04/11/2017 208  150 - 400 K/uL Final  . Sodium 04/11/2017 142  135 - 145 mmol/L Final  . Potassium 04/11/2017 4.0  3.5 - 5.1 mmol/L Final  . Chloride 04/11/2017 109  101 - 111 mmol/L Final  . CO2 04/11/2017 27  22 - 32 mmol/L Final  . Glucose, Bld 04/11/2017 116* 65 - 99 mg/dL Final  . BUN 04/11/2017 11  6 - 20 mg/dL Final  . Creatinine, Ser 04/11/2017 0.61  0.44 - 1.00 mg/dL Final  . Calcium 04/11/2017 9.2  8.9 - 10.3 mg/dL Final  . GFR calc non Af Amer 04/11/2017 >60  >60 mL/min Final  . GFR calc Af Amer 04/11/2017 >60  >60 mL/min Final   Comment: (NOTE) The eGFR has  been calculated using the CKD EPI equation. This calculation has not been validated in all clinical situations. eGFR's persistently <60 mL/min signify possible Chronic Kidney Disease.   Georgiann Hahn gap 04/11/2017 6  5 - 15 Final  Hospital Outpatient Visit on 04/02/2017  Component Date Value Ref Range Status  . Glucose-Capillary 04/02/2017 94  65 - 99 mg/dL Final  Hospital Outpatient Visit on 04/02/2017  Component Date Value Ref Range Status  . ABO/RH(D) 04/02/2017 A POS   Final  . Antibody Screen 04/02/2017 NEG   Final  . Sample Expiration 04/02/2017 04/13/2017   Final  . Extend sample reason 04/02/2017 NO TRANSFUSIONS OR PREGNANCY IN THE PAST 3 MONTHS   Final  . MRSA, PCR 04/02/2017 NEGATIVE  NEGATIVE Final  . Staphylococcus aureus 04/02/2017 NEGATIVE  NEGATIVE Final   Comment: (NOTE) The Xpert SA Assay (FDA approved for NASAL specimens in patients 26 years of age and older), is one component of a comprehensive surveillance program. It is not intended to diagnose infection nor to guide or monitor treatment.   . Hgb A1c MFr Bld 04/02/2017 6.0* 4.8 - 5.6 % Final   Comment: (NOTE) Pre diabetes:          5.7%-6.4% Diabetes:              >6.4% Glycemic control for   <7.0% adults with diabetes   . Mean Plasma Glucose 04/02/2017 125.5  mg/dL Final   Performed at [facility] [address], [place], [place] [address]  Office Visit on 03/15/2017  Component Date Value Ref Range Status  . Clinical Information: 03/15/2017    Final   None given  . LMP: 03/15/2017    Final   NONE GIVEN  . PREV. PAP: 03/15/2017    Final   NONE GIVEN  . PREV. BX: 03/15/2017    Final   NONE GIVEN  . HPV DNA Probe-Source 03/15/2017    Final   Vagina  . STATEMENT OF ADEQUACY: 03/15/2017    Final   SATISFACTORY FOR EVALUATION  . INTERPRETATION/RESULT: 03/15/2017    Final   Comment: Negative for intraepithelial lesion or malignancy. Atrophic pattern; predominantly parabasal cells   . Comment:  03/15/2017    Final   Comment: This Pap test has been evaluated  with computer assisted technology.   . CYTOTECHNOLOGIST: 03/15/2017    Final   Comment: EAW, CT(ASCP) CT screening location: [address], [place], [place] [address] EXPLANATORY NOTE:  . The Pap is a screening test for cervical cancer. It is  not a diagnostic test and is subject to false negative  and false positive results. It is most reliable when a  satisfactory sample, regularly obtained, is submitted  with relevant clinical findings and history, and when  the Pap result is evaluated along with historic and  current clinical information. Marland Kitchen      X-Rays:Dg Chest 2 View  Result Date: 04/02/2017 CLINICAL DATA:  Preop evaluation for upcoming hip replacement EXAM: CHEST  2 VIEW COMPARISON:  None. FINDINGS: The heart size and mediastinal contours are within normal limits. Both lungs are clear. The visualized skeletal structures show degenerative changes of the thoracic spine. IMPRESSION: No active cardiopulmonary disease. Electronically Signed   By: Inez Catalina M.D.   On: 04/02/2017 19:07   Dg Pelvis Portable  Result Date: 04/10/2017 CLINICAL DATA:  Status post left hip replacement today. EXAM: PORTABLE PELVIS 1-2 VIEWS COMPARISON:  None. FINDINGS: Left total hip arthroplasty is in place. The device is located. No fracture. Surgical drain noted. IMPRESSION: Status post left total hip replacement.  No acute finding. Electronically Signed   By: Inge Rise M.D.   On: 04/10/2017 13:11   Dg C-arm 1-60 Min-no Report  Result Date: 04/10/2017 Fluoroscopy was utilized by the requesting physician.  No radiographic interpretation.   US Breast Ltd Uni Left Inc Axilla  Result Date: 03/21/2017 CLINICAL DATA:  76 year old female with a palpable abnormality in the region of the left inframammary fold felt by her physician. Hot EXAM: 2D DIGITAL DIAGNOSTIC UNILATERAL LEFT MAMMOGRAM WITH CAD AND ADJUNCT TOMO LEFT BREAST  ULTRASOUND COMPARISON:  Previous exam(s). ACR Breast Density Category b: There are scattered areas of fibroglandular density. FINDINGS: No suspicious masses or calcifications are seen in the left breast. There is no mammographic evidence of malignancy in the left breast. Mammographic images were processed with CAD. Physical examination at site of palpable concern reveals a linear ridge of tissue corresponding to the inframammary fold. No underlying palpable masses. Targeted ultrasound of the left breast was performed. No suspicious masses or abnormalities identified. IMPRESSION: No findings of malignancy in the left breast. No abnormalities identified at site of palpable concern. RECOMMENDATION: 1. Clinical follow-up for left breast palpable abnormality. 2. Recommend annual routine screening mammography, due December 2018. I have discussed the findings and recommendations with the patient. Results were also provided in writing at the conclusion of the visit. If applicable, a reminder letter will be sent to the patient regarding the next appointment. BI-RADS CATEGORY  1: Negative. Electronically Signed   By: Everlean Alstrom M.D.   On: 03/21/2017 14:35   Mm Diag Breast Tomo Uni Left  Result Date: 03/21/2017 CLINICAL DATA:  76 year old female with a palpable abnormality in the region of the left inframammary fold felt by her physician. Hot EXAM: 2D DIGITAL DIAGNOSTIC UNILATERAL LEFT MAMMOGRAM WITH CAD AND ADJUNCT TOMO LEFT BREAST ULTRASOUND COMPARISON:  Previous exam(s). ACR Breast Density Category b: There are scattered areas of fibroglandular density. FINDINGS: No suspicious masses or calcifications are seen in the left breast. There is no mammographic evidence of malignancy in the left breast. Mammographic images were processed with CAD. Physical examination at site of palpable concern reveals a linear ridge of tissue corresponding to the inframammary fold. No underlying palpable  masses. Targeted ultrasound of  the left breast was performed. No suspicious masses or abnormalities identified. IMPRESSION: No findings of malignancy in the left breast. No abnormalities identified at site of palpable concern. RECOMMENDATION: 1. Clinical follow-up for left breast palpable abnormality. 2. Recommend annual routine screening mammography, due December 2018. I have discussed the findings and recommendations with the patient. Results were also provided in writing at the conclusion of the visit. If applicable, a reminder letter will be sent to the patient regarding the next appointment. BI-RADS CATEGORY  1: Negative. Electronically Signed   By: Everlean Alstrom M.D.   On: 03/21/2017 14:35    EKG: Orders placed or performed during the hospital encounter of 04/02/17  . EKG 12 lead  . EKG 12 lead     Hospital Course: Patient was admitted to [facility] and taken to the OR and underwent the above state procedure without complications.  Patient tolerated the procedure well and was later transferred to the recovery room and then to the orthopaedic floor for postoperative care.  They were given PO and IV analgesics for pain control following their surgery.  They were given 24 hours of postoperative antibiotics of  Anti-infectives (From admission, onward)   Start     Dose/Rate Route Frequency Ordered Stop   04/10/17 1600  ceFAZolin (ANCEF) IVPB 2g/100 mL premix     2 g 200 mL/hr over 30 Minutes Intravenous Every 6 hours 04/10/17 1337 04/10/17 2244   04/10/17 0827  ceFAZolin (ANCEF) 2-4 GM/100ML-% IVPB    Comments:  Bridget Hartshorn   : cabinet override      04/10/17 0827 04/10/17 1002   04/10/17 0820  ceFAZolin (ANCEF) IVPB 2g/100 mL premix     2 g 200 mL/hr over 30 Minutes Intravenous On call to O.R. 04/10/17 0820 04/10/17 1007     and started on DVT prophylaxis in the form of Xarelto.   PT and OT were ordered for total hip protocol.  The patient was allowed to be WBAT with therapy. Discharge planning was  consulted to help with postop disposition and equipment needs.  Patient had a good night on the evening of surgery.  They started to get up OOB with therapy on day one.  Hemovac drain was pulled without difficulty. Dressing was checked and  was clean and dry.  Patient was seen in rounds on day one by Dr. Wynelle Link and was ready to go home following therapy goals  Discharge home with home health Diet - Cardiac diet and Diabetic diet Follow up - in 2 weeks Activity - WBAT Disposition - Home Condition Upon Discharge - improving D/C Meds - See DC Summary DVT Prophylaxis - Xarelto    Discharge Instructions    Call MD / Call 911   Complete by:  As directed    If you experience chest pain or shortness of breath, CALL 911 and be transported to the hospital emergency room.  If you develope a fever above 101 F, pus (white drainage) or increased drainage or redness at the wound, or calf pain, call your surgeon's office.   Change dressing   Complete by:  As directed    You may change your dressing dressing daily with sterile 4 x 4 inch gauze dressing and paper tape.  Do not submerge the incision under water.   Constipation Prevention   Complete by:  As directed    Drink plenty of fluids.  Prune juice may be helpful.  You may use a stool  softener, such as Colace (over the counter) 100 mg twice a day.  Use MiraLax (over the counter) for constipation as needed.   Diet - low sodium heart healthy   Complete by:  As directed    Diet Carb Modified   Complete by:  As directed    Discharge instructions   Complete by:  As directed    Take Xarelto for two and a half more weeks, then discontinue Xarelto. Once the patient has completed the Xarelto, they may resume the 81 mg Aspirin.   Pick up stool softner and laxative for home use following surgery while on pain medications. Do not submerge incision under water. Please use good hand washing techniques while changing dressing each day. May shower starting  three days after surgery. Please use a clean towel to pat the incision dry following showers. Continue to use ice for pain and swelling after surgery. Do not use any lotions or creams on the incision until instructed by your surgeon.  Wear both TED hose on both legs during the day every day for three weeks, but may remove the TED hose at night at home.  Postoperative Constipation Protocol  Constipation - defined medically as fewer than three stools per week and severe constipation as less than one stool per week.  One of the most common issues patients have following surgery is constipation.  Even if you have a regular bowel pattern at home, your normal regimen is likely to be disrupted due to multiple reasons following surgery.  Combination of anesthesia, postoperative narcotics, change in appetite and fluid intake all can affect your bowels.  In order to avoid complications following surgery, here are some recommendations in order to help you during your recovery period.  Colace (docusate) - Pick up an over-the-counter form of Colace or another stool softener and take twice a day as long as you are requiring postoperative pain medications.  Take with a full glass of water daily.  If you experience loose stools or diarrhea, hold the colace until you stool forms back up.  If your symptoms do not get better within 1 week or if they get worse, check with your doctor.  Dulcolax (bisacodyl) - Pick up over-the-counter and take as directed by the product packaging as needed to assist with the movement of your bowels.  Take with a full glass of water.  Use this product as needed if not relieved by Colace only.   MiraLax (polyethylene glycol) - Pick up over-the-counter to have on hand.  MiraLax is a solution that will increase the amount of water in your bowels to assist with bowel movements.  Take as directed and can mix with a glass of water, juice, soda, coffee, or tea.  Take if you go more than two days  without a movement. Do not use MiraLax more than once per day. Call your doctor if you are still constipated or irregular after using this medication for 7 days in a row.  If you continue to have problems with postoperative constipation, please contact the office for further assistance and recommendations.  If you experience "the worst abdominal pain ever" or develop nausea or vomiting, please contact the office immediatly for further recommendations for treatment.   Do not sit on low chairs, stoools or toilet seats, as it may be difficult to get up from low surfaces   Complete by:  As directed    Driving restrictions   Complete by:  As directed    No driving  until released by the physician.   Increase activity slowly as tolerated   Complete by:  As directed    Lifting restrictions   Complete by:  As directed    No lifting until released by the physician.   Patient may shower   Complete by:  As directed    You may shower without a dressing once there is no drainage.  Do not wash over the wound.  If drainage remains, do not shower until drainage stops.   TED hose   Complete by:  As directed    Use stockings (TED hose) for 3 weeks on both leg(s).  You may remove them at night for sleeping.   Weight bearing as tolerated   Complete by:  As directed    Laterality:  left   Extremity:  Lower     Allergies as of 04/11/2017      Reactions   Pneumococcal Vaccine Other (See Comments)   Headache,  Fever and swollen at injection site      Medication List    STOP taking these medications   amoxicillin 500 MG capsule Commonly known as:  AMOXIL   aspirin EC 81 MG tablet   b complex vitamins capsule   multivitamin tablet   Omega 3 1200 MG Caps     TAKE these medications   carvedilol 40 MG 24 hr capsule Commonly known as:  COREG CR Take 40 mg by mouth every morning.   docusate sodium 100 MG capsule Commonly known as:  COLACE Take 200 mg every other day by mouth.     hydrochlorothiazide 25 MG tablet Commonly known as:  HYDRODIURIL Take 25 mg daily by mouth.   methocarbamol 500 MG tablet Commonly known as:  ROBAXIN Take 1 tablet (500 mg total) by mouth every 6 (six) hours as needed for muscle spasms.   oxyCODONE 5 MG immediate release tablet Commonly known as:  Oxy IR/ROXICODONE Take 1-2 tablets (5-10 mg total) by mouth every 4 (four) hours as needed for moderate pain or severe pain.   polyethylene glycol packet Commonly known as:  MIRALAX / GLYCOLAX Take 17 g every other day by mouth.   quinapril 40 MG tablet Commonly known as:  ACCUPRIL Take 40 mg every evening by mouth.   rivaroxaban 10 MG Tabs tablet Commonly known as:  XARELTO Take 1 tablet (10 mg total) by mouth daily with breakfast. Take Xarelto for two and a half more weeks following discharge from the hospital, then discontinue Xarelto. Once the patient has completed the Xarelto, they may resume the 81 mg Aspirin.   rosuvastatin 20 MG tablet Commonly known as:  CRESTOR Take 20 mg daily by mouth.   traMADol 50 MG tablet Commonly known as:  ULTRAM Take 1-2 tablets (50-100 mg total) by mouth every 6 (six) hours as needed for moderate pain.            Discharge Care Instructions  (From admission, onward)        Start     Ordered   04/11/17 0000  Weight bearing as tolerated    Question Answer Comment  Laterality left   Extremity Lower      04/11/17 0730   04/11/17 0000  Change dressing    Comments:  You may change your dressing dressing daily with sterile 4 x 4 inch gauze dressing and paper tape.  Do not submerge the incision under water.   04/11/17 0730     Follow-up Information    Aluisio, Pilar Plate,  MD. Schedule an appointment as soon as possible for a visit on 04/23/2017.   Specialty:  Orthopedic Surgery Contact information: [address] [place] [address] [phone]           Signed: Arlee Muslim, PA-C Orthopaedic  Surgery 04/11/2017, 7:31 AM

## 2017-04-11 NOTE — Progress Notes (Signed)
Physical Therapy Treatment Patient Details Name: Tamara Anthony MRN: [id number] DOB: 09/11/1940 Today's Date: 04/11/2017    History of Present Illness Pt s/p L THR and with hx of L TKR (06) and DM    PT Comments    Pt very motivated and progressing exceptionally well with mobility.  Reviewed therex, stairs and car transfers.   Follow Up Recommendations  DC plan and follow up therapy as arranged by surgeon;Home health PT     Equipment Recommendations  None recommended by PT    Recommendations for Other Services       Precautions / Restrictions Precautions Precautions: Fall Restrictions Weight Bearing Restrictions: No Other Position/Activity Restrictions: WBAT    Mobility  Bed Mobility Overal bed mobility: Needs Assistance Bed Mobility: Supine to Sit     Supine to sit: Supervision     General bed mobility comments: Pt unassisted to EOB  Transfers Overall transfer level: Needs assistance Equipment used: Rolling walker (2 wheeled) Transfers: Sit to/from Stand Sit to Stand: Supervision         General transfer comment: min cues for LE management and use of UEs to self assist  Ambulation/Gait Ambulation/Gait assistance: Min guard;Supervision Ambulation Distance (Feet): 250 Feet Assistive device: Rolling walker (2 wheeled) Gait Pattern/deviations: Step-to pattern;Step-through pattern;Decreased step length - right;Decreased step length - left;Shuffle;Trunk flexed Gait velocity: decr Gait velocity interpretation: Below normal speed for age/gender General Gait Details: min cues for posture, position from RW and initial sequence   Stairs Stairs: Yes   Stair Management: No rails;Step to pattern;Forwards;With walker Number of Stairs: 2 General stair comments: single step twice with RW and cues for sequence  Wheelchair Mobility    Modified Rankin (Stroke Patients Only)       Balance Overall balance assessment: No apparent balance deficits (not formally  assessed)                                          Cognition Arousal/Alertness: Awake/alert Behavior During Therapy: WFL for tasks assessed/performed Overall Cognitive Status: Within Functional Limits for tasks assessed                                        Exercises Total Joint Exercises Ankle Circles/Pumps: AROM;Both;20 reps;Supine Quad Sets: AROM;Both;15 reps;Supine Heel Slides: AAROM;Left;20 reps;Supine Hip ABduction/ADduction: AAROM;Left;15 reps;Supine    General Comments        Pertinent Vitals/Pain Pain Assessment: 0-10 Pain Score: 4  Pain Location: L hip Pain Descriptors / Indicators: Aching;Sore Pain Intervention(s): Limited activity within patient's tolerance;Monitored during session;Premedicated before session;Ice applied    Home Living                      Prior Function            PT Goals (current goals can now be found in the care plan section) Acute Rehab PT Goals Patient Stated Goal: Regain IND and resume previous lifestyle PT Goal Formulation: With patient Time For Goal Achievement: 04/15/17 Potential to Achieve Goals: Good Progress towards PT goals: Progressing toward goals    Frequency    7X/week      PT Plan Current plan remains appropriate    Co-evaluation              AM-PAC PT "6 Clicks" Daily  Activity  Outcome Measure  Difficulty turning over in bed (including adjusting bedclothes, sheets and blankets)?: A Lot Difficulty moving from lying on back to sitting on the side of the bed? : A Little Difficulty sitting down on and standing up from a chair with arms (e.g., wheelchair, bedside commode, etc,.)?: A Little Help needed moving to and from a bed to chair (including a wheelchair)?: A Little Help needed walking in hospital room?: A Little Help needed climbing 3-5 steps with a railing? : A Little 6 Click Score: 17    End of Session Equipment Utilized During Treatment: Gait  belt Activity Tolerance: Patient tolerated treatment well Patient left: in chair;with call bell/phone within reach;with chair alarm set Nurse Communication: Mobility status PT Visit Diagnosis: Difficulty in walking, not elsewhere classified (R26.2)     Time: 1002-1030 PT Time Calculation (min) (ACUTE ONLY): 28 min  Charges:  $Gait Training: 8-22 mins $Therapeutic Exercise: 8-22 mins                    G Codes:       Pg [phone]    Rabon Scholle 04/11/2017, 12:05 PM

## 2017-04-11 NOTE — Progress Notes (Signed)
   Subjective: 1 Day Post-Op Procedure(s) (LRB): LEFT TOTAL HIP ARTHROPLASTY ANTERIOR APPROACH (Left) Patient reports pain as mild.   Patient seen in rounds by Dr. Wynelle Link. Patient is well, but has had some minor complaints of pain in the hip, requiring pain medications We will start therapy today.  If they do well with therapy and meets all goals, then will allow home later this afternoon following therapy. Plan is to go Home after hospital stay.  Objective: Vital signs in last 24 hours: Temp:  [97.4 F (36.3 C)-98.2 F (36.8 C)] 97.9 F (36.6 C) (11/29 0527) Pulse Rate:  [61-86] 83 (11/29 0527) Resp:  [11-18] 14 (11/29 0527) BP: (112-165)/(56-82) 165/56 (11/29 0527) SpO2:  [94 %-100 %] 100 % (11/29 0527) Weight:  [82.6 kg (182 lb)] 82.6 kg (182 lb) (11/28 0855)  Intake/Output from previous day:  Intake/Output Summary (Last 24 hours) at 04/11/2017 0723 Last data filed at 04/11/2017 0527 Gross per 24 hour  Intake 4030 ml  Output 2410 ml  Net 1620 ml    Intake/Output this shift: No intake/output data recorded.  Labs: Recent Labs    04/10/17 0840 04/11/17 0552  HGB 13.7 12.5   Recent Labs    04/10/17 0840 04/11/17 0552  WBC 6.8 12.9*  RBC 4.74 4.27  HCT 40.8 36.9  PLT 244 208   Recent Labs    04/10/17 0840 04/11/17 0552  NA 139 142  K 3.6 4.0  CL 105 109  CO2 26 27  BUN 15 11  CREATININE 0.78 0.61  GLUCOSE 114* 116*  CALCIUM 9.6 9.2   Recent Labs    04/10/17 0840  INR 0.96    EXAM General - Patient is Alert and Appropriate Extremity - Neurovascular intact Sensation intact distally Intact pulses distally Dorsiflexion/Plantar flexion intact Dressing - dressing C/D/I Motor Function - intact, moving foot and toes well on exam.  Hemovac pulled without difficulty.  Past Medical History:  Diagnosis Date  . Arthritis   . Colon polyps   . Diabetes mellitus    type 2  . Hyperlipidemia   . Hypertension   . S/P vaginal hysterectomy    03/2012      Assessment/Plan: 1 Day Post-Op Procedure(s) (LRB): LEFT TOTAL HIP ARTHROPLASTY ANTERIOR APPROACH (Left) Principal Problem:   OA (osteoarthritis) of hip  Estimated body mass index is 30.29 kg/m as calculated from the following:   Height as of this encounter: 5\' 5"  (1.651 m).   Weight as of this encounter: 82.6 kg (182 lb). Advance diet Up with therapy Discharge home with home health  DVT Prophylaxis - Xarelto Weight Bearing As Tolerated left Leg Hemovac Pulled Begin Therapy  If meets goals and able to go home: Up with therapy Discharge home with home health Diet - Cardiac diet and Diabetic diet Follow up - in 2 weeks Activity - WBAT Disposition - Home Condition Upon Discharge - improving D/C Meds - See DC Summary DVT Prophylaxis - Xarelto  Arlee Muslim, PA-C Orthopaedic Surgery 04/11/2017, 7:23 AM

## 2017-04-11 NOTE — Progress Notes (Addendum)
Discharge planning, spoke with patient and spouse at bedside. Have chosen [facility]. Contacted [facility] for referral. Has RW and declines 3n1. [phone]

## 2017-06-19 ENCOUNTER — Other Ambulatory Visit: Payer: Self-pay | Admitting: Obstetrics & Gynecology

## 2017-06-19 DIAGNOSIS — Z139 Encounter for screening, unspecified: Secondary | ICD-10-CM

## 2017-06-20 ENCOUNTER — Ambulatory Visit
Admission: RE | Admit: 2017-06-20 | Discharge: 2017-06-20 | Disposition: A | Discharge status: Home / Self Care | Payer: Medicare Other | Source: Ambulatory Visit | Attending: Obstetrics & Gynecology | Admitting: Obstetrics & Gynecology

## 2017-06-20 DIAGNOSIS — Z139 Encounter for screening, unspecified: Secondary | ICD-10-CM

## 2017-06-24 DIAGNOSIS — M51369 Other intervertebral disc degeneration, lumbar region without mention of lumbar back pain or lower extremity pain: Secondary | ICD-10-CM | POA: Insufficient documentation

## 2017-06-24 DIAGNOSIS — M5136 Other intervertebral disc degeneration, lumbar region: Secondary | ICD-10-CM | POA: Insufficient documentation

## 2017-09-04 ENCOUNTER — Encounter: Payer: Self-pay | Admitting: Podiatry

## 2017-09-04 ENCOUNTER — Ambulatory Visit: Payer: Medicare Other

## 2017-09-04 ENCOUNTER — Ambulatory Visit (INDEPENDENT_AMBULATORY_CARE_PROVIDER_SITE_OTHER): Payer: Medicare Other

## 2017-09-04 ENCOUNTER — Ambulatory Visit (INDEPENDENT_AMBULATORY_CARE_PROVIDER_SITE_OTHER): Payer: Medicare Other | Admitting: Podiatry

## 2017-09-04 DIAGNOSIS — M25571 Pain in right ankle and joints of right foot: Secondary | ICD-10-CM

## 2017-09-04 DIAGNOSIS — B351 Tinea unguium: Secondary | ICD-10-CM | POA: Diagnosis not present

## 2017-09-04 DIAGNOSIS — M25572 Pain in left ankle and joints of left foot: Principal | ICD-10-CM

## 2017-09-04 DIAGNOSIS — M779 Enthesopathy, unspecified: Secondary | ICD-10-CM

## 2017-09-04 DIAGNOSIS — M7750 Other enthesopathy of unspecified foot: Secondary | ICD-10-CM

## 2017-09-04 DIAGNOSIS — M79674 Pain in right toe(s): Secondary | ICD-10-CM | POA: Diagnosis not present

## 2017-09-04 DIAGNOSIS — M79675 Pain in left toe(s): Secondary | ICD-10-CM | POA: Diagnosis not present

## 2017-09-04 MED ORDER — TRIAMCINOLONE ACETONIDE 10 MG/ML IJ SUSP
10.0000 mg | Freq: Once | INTRAMUSCULAR | Status: AC
  Administered 2017-09-04: 10 mg

## 2017-09-05 NOTE — Progress Notes (Signed)
Subjective:   Patient ID: Tamara Anthony, female   DOB: 77 y.o.   MRN: [id number]   HPI Patient presents with discomfort around the peroneal tendon group lateral side of the foot bilateral and also has nail disease 1-5 both feet that she cannot cut become painful with shoe gear   ROS      Objective:  Physical Exam  Neurovascular status intact muscle strength was adequate with discomfort of the peroneal groove bilateral with thick yellow brittle nailbeds 1-5 both feet that are painful     Assessment:  Mycotic nail infection 1-5 both feet with tendinitis perineal bilateral     Plan:  H&P condition reviewed and careful perineal injection administered 3 mg Kenalog 5 Milgram Xylocaine and debrided nail bed 1-5 both feet with no echogenic bleeding and reappoint for routine care

## 2017-12-27 ENCOUNTER — Ambulatory Visit (INDEPENDENT_AMBULATORY_CARE_PROVIDER_SITE_OTHER): Payer: Medicare Other | Admitting: Podiatry

## 2017-12-27 ENCOUNTER — Encounter: Payer: Self-pay | Admitting: Podiatry

## 2017-12-27 DIAGNOSIS — M79675 Pain in left toe(s): Secondary | ICD-10-CM

## 2017-12-27 DIAGNOSIS — B351 Tinea unguium: Secondary | ICD-10-CM

## 2017-12-27 DIAGNOSIS — L84 Corns and callosities: Secondary | ICD-10-CM | POA: Diagnosis not present

## 2017-12-27 DIAGNOSIS — E1142 Type 2 diabetes mellitus with diabetic polyneuropathy: Secondary | ICD-10-CM

## 2017-12-27 DIAGNOSIS — M79674 Pain in right toe(s): Secondary | ICD-10-CM | POA: Diagnosis not present

## 2017-12-27 NOTE — Progress Notes (Signed)
Subjective: Tamara Anthony presents with diabetes, diabetic neuropathy and cc of painful, discolored, thick toenails and painful callus/corn which interfere with activities of daily living. Pain is aggravated when wearing enclosed shoe gear. Pain is relieved with periodic professional debridement.  Patient also has callus right foot and corn right 5th digit.  She states she will be going to [place] with her family next week and wants to make sure her feet are ok. She will be renting a scooter to navigate the amusement park with her husband, daughter, son-in-law and grandkids. Her husband is currently being worked up for atrial fibrillation and she hopes they don't have to cancel their trip.  Objective: Vascular Examination: Capillary refill time <3 seconds x 10 digits Dorsalis pedis and Posterior tibial pulses present b/l No digital hair x 10 digits Skin temperature warm to cool b/l  Dermatological Examination: Skin with normal turgor, texture and tone b/l Toenails 1-5 b/l discolored, thick, dystrophic with subungual debris and pain with palpation to nailbeds due to thickness of nails. Hyperkeratotic lesion   Musculoskeletal: Muscle strength 5/5 to all LE muscle groups Palpable nodule noted plantar IPJ right hallux; movable. No erythema, no edema, no flocculence. No pain on palpation. She notes no change in size and no pain with weightbearing. Hammertoe deformity right 5th digit  Neurological: Sensation diminished with 10 gram monofilament. Vibratory sensation diminished  Assessment: 1. Painful onychomycosis toenails 1-5 b/l 2. Callus submetatarsal head 1 right foot 3. Corn dorsal right 5th digit PIPJ 4. NIDDM with Diabetic neuropathy  Plan: 1. Continue diabetic foot care principles.  2. Toenails 1-5 b/l were debrided in length and girth without iatrogenic bleeding. 3. Hyperkeratotic lesions debrided right submet head 1 and right 5th digit PIPJ 4. Patient to continue soft,  supportive shoe gear 5. Patient to report any pedal injuries to medical professional  6. Follow up 4 months per patient request 7. Patient to call should there be a concern in the interim.

## 2018-04-22 ENCOUNTER — Ambulatory Visit (INDEPENDENT_AMBULATORY_CARE_PROVIDER_SITE_OTHER): Payer: Medicare Other | Admitting: Obstetrics & Gynecology

## 2018-04-22 ENCOUNTER — Encounter: Payer: Medicare Other | Admitting: Obstetrics & Gynecology

## 2018-04-22 ENCOUNTER — Encounter: Payer: Self-pay | Admitting: Obstetrics & Gynecology

## 2018-04-22 VITALS — BP 130/70 | Ht 65.0 in | Wt 188.0 lb

## 2018-04-22 DIAGNOSIS — Z90710 Acquired absence of both cervix and uterus: Secondary | ICD-10-CM

## 2018-04-22 DIAGNOSIS — Z780 Asymptomatic menopausal state: Secondary | ICD-10-CM

## 2018-04-22 DIAGNOSIS — Z01419 Encounter for gynecological examination (general) (routine) without abnormal findings: Secondary | ICD-10-CM

## 2018-04-22 DIAGNOSIS — Z6831 Body mass index (BMI) 31.0-31.9, adult: Secondary | ICD-10-CM

## 2018-04-22 DIAGNOSIS — E6609 Other obesity due to excess calories: Secondary | ICD-10-CM

## 2018-04-22 NOTE — Progress Notes (Signed)
Tamara Anthony [birth year] [id number]   History:    77 y.o. G2P2L2  RP:  Established patient presenting for annual gyn exam   HPI: Status post vaginal hysterectomy with anterior repair.  Menopause, well on no hormone replacement therapy.  No pelvic pain.  Abstinent.  Urine and bowel movements normal.  Breasts normal.  Body mass index 31.28.  Health labs with family physician.  Past medical history,surgical history, family history and social history were all reviewed and documented in the EPIC chart.  Gynecologic History No LMP recorded. Patient has had a hysterectomy. Contraception: status post hysterectomy Last Pap: 03/2017. Results were: Negative Last mammogram: 06/2017. Results were: Negative Bone Density: 04/2016 Normal Colonoscopy: 2015  Obstetric History OB History  Gravida Para Term Preterm AB Living  2 2 2     2   SAB TAB Ectopic Multiple Live Births          2    # Outcome Date GA Lbr Len/2nd Weight Sex Delivery Anes PTL Lv  2 Term     F Vag-Spont  N LIV  1 Term     F Vag-Spont  N LIV     ROS: A ROS was performed and pertinent positives and negatives are included in the history.  GENERAL: No fevers or chills. HEENT: No change in vision, no earache, sore throat or sinus congestion. NECK: No pain or stiffness. CARDIOVASCULAR: No chest pain or pressure. No palpitations. PULMONARY: No shortness of breath, cough or wheeze. GASTROINTESTINAL: No abdominal pain, nausea, vomiting or diarrhea, melena or bright red blood per rectum. GENITOURINARY: No urinary frequency, urgency, hesitancy or dysuria. MUSCULOSKELETAL: No joint or muscle pain, no back pain, no recent trauma. DERMATOLOGIC: No rash, no itching, no lesions. ENDOCRINE: No polyuria, polydipsia, no heat or cold intolerance. No recent change in weight. HEMATOLOGICAL: No anemia or easy bruising or bleeding. NEUROLOGIC: No headache, seizures, numbness, tingling or weakness. PSYCHIATRIC: No depression, no loss of interest in normal  activity or change in sleep pattern.     Exam:   BP 130/70   Ht 5\' 5"  (1.651 m)   Wt 188 lb (85.3 kg)   BMI 31.28 kg/m   Body mass index is 31.28 kg/m.  General appearance : Well developed well nourished female. No acute distress HEENT: Eyes: no retinal hemorrhage or exudates,  Neck supple, trachea midline, no carotid bruits, no thyroidmegaly Lungs: Clear to auscultation, no rhonchi or wheezes, or rib retractions  Heart: Regular rate and rhythm, no murmurs or gallops Breast:Examined in sitting and supine position were symmetrical in appearance, no palpable masses or tenderness,  No skin retraction, no nipple inversion, no nipple discharge, no skin discoloration, no axillary or supraclavicular lymphadenopathy Abdomen: no palpable masses or tenderness, no rebound or guarding Extremities: no edema or skin discoloration or tenderness  Pelvic: Vulva: Normal             Vagina: No gross lesions or discharge  Cervix/Uterus absent  Adnexa  Without masses or tenderness  Anus: Normal   Assessment/Plan:  77 y.o. female for annual exam   1. Well female exam with routine gynecological exam Gynecologic exam status post vaginal hysterectomy and menopause.  Pap test was negative in November 2018.  No indication to repeat this year.  Breast exam normal.last screening mammogram February 2019 was negative.  Colonoscopy in 2015.  Health labs with family physician.   2. S/P vaginal hysterectomy  3. Postmenopausal Well on no hormone replacement therapy.  Recommend vitamin D supplements, calcium  intake of 1.5 g/day and regular weightbearing physical activities.  4. Class 1 obesity due to excess calories without serious comorbidity with body mass index (BMI) of 31.0 to 31.9 in adult Recommend low calorie/carb diet such as [employer] with increased aerobic physical activities 5 times a week with weightlifting every 2 days.  Other orders - magnesium citrate SOLN; Take 1 Bottle by mouth  once.  Princess Bruins MD, 3:16 PM 04/22/2018

## 2018-04-25 ENCOUNTER — Encounter: Payer: Self-pay | Admitting: Obstetrics & Gynecology

## 2018-04-25 NOTE — Patient Instructions (Signed)
1. Well female exam with routine gynecological exam Gynecologic exam status post vaginal hysterectomy and menopause.  Pap test was negative in November 2018.  No indication to repeat this year.  Breast exam normal.last screening mammogram February 2019 was negative.  Colonoscopy in 2015.  Health labs with family physician.   2. S/P vaginal hysterectomy  3. Postmenopausal Well on no hormone replacement therapy.  Recommend vitamin D supplements, calcium intake of 1.5 g/day and regular weightbearing physical activities.  4. Class 1 obesity due to excess calories without serious comorbidity with body mass index (BMI) of 31.0 to 31.9 in adult Recommend low calorie/carb diet such as [employer] with increased aerobic physical activities 5 times a week with weightlifting every 2 days.  Other orders - magnesium citrate SOLN; Take 1 Bottle by mouth once.  Heath, it was a pleasure seeing you today!

## 2018-05-22 ENCOUNTER — Other Ambulatory Visit: Payer: Self-pay | Admitting: Obstetrics & Gynecology

## 2018-05-22 DIAGNOSIS — Z1231 Encounter for screening mammogram for malignant neoplasm of breast: Secondary | ICD-10-CM

## 2018-06-23 ENCOUNTER — Ambulatory Visit
Admission: RE | Admit: 2018-06-23 | Discharge: 2018-06-23 | Disposition: A | Discharge status: Home / Self Care | Payer: Medicare Other | Source: Ambulatory Visit | Attending: Obstetrics & Gynecology | Admitting: Obstetrics & Gynecology

## 2018-06-23 DIAGNOSIS — Z1231 Encounter for screening mammogram for malignant neoplasm of breast: Secondary | ICD-10-CM

## 2018-06-25 ENCOUNTER — Ambulatory Visit (INDEPENDENT_AMBULATORY_CARE_PROVIDER_SITE_OTHER): Payer: Medicare Other | Admitting: Podiatry

## 2018-06-25 ENCOUNTER — Other Ambulatory Visit: Payer: Self-pay

## 2018-06-25 DIAGNOSIS — M79674 Pain in right toe(s): Secondary | ICD-10-CM

## 2018-06-25 DIAGNOSIS — L84 Corns and callosities: Secondary | ICD-10-CM | POA: Diagnosis not present

## 2018-06-25 DIAGNOSIS — E1142 Type 2 diabetes mellitus with diabetic polyneuropathy: Secondary | ICD-10-CM | POA: Diagnosis not present

## 2018-06-25 DIAGNOSIS — B351 Tinea unguium: Secondary | ICD-10-CM

## 2018-06-25 DIAGNOSIS — M79675 Pain in left toe(s): Secondary | ICD-10-CM

## 2018-06-25 NOTE — Patient Instructions (Signed)

## 2018-06-30 ENCOUNTER — Encounter: Payer: Self-pay | Admitting: Podiatry

## 2018-06-30 NOTE — Progress Notes (Signed)
Subjective: Tamara Anthony presents with diabetes, diabetic neuropathy and cc of painful, discolored, thick toenails and painful callus/corn which interfere with activities of daily living. Pain is aggravated when wearing enclosed shoe gear. Pain is relieved with periodic professional debridement.  Jefm Petty, MD is her PCP.    Current Outpatient Medications:  .  B Complex Vitamins (VITAMIN B-COMPLEX) TABS, Take by mouth., Disp: , Rfl:  .  carvedilol (COREG CR) 40 MG 24 hr capsule, Take 40 mg by mouth every morning. , Disp: , Rfl:  .  docusate sodium (COLACE) 100 MG capsule, Take 200 mg every other day by mouth. , Disp: , Rfl:  .  hydrochlorothiazide (HYDRODIURIL) 25 MG tablet, Take 25 mg daily by mouth., Disp: , Rfl: 1 .  Magnesium 100 MG CAPS, magnesium, Disp: , Rfl:  .  magnesium citrate SOLN, Take 1 Bottle by mouth once., Disp: , Rfl:  .  omega-3 acid ethyl esters (LOVAZA) 1 g capsule, Take by mouth., Disp: , Rfl:  .  polyethylene glycol (MIRALAX / GLYCOLAX) packet, Take 17 g every other day by mouth., Disp: , Rfl:  .  quinapril (ACCUPRIL) 40 MG tablet, Take 40 mg every evening by mouth. , Disp: , Rfl:  .  rosuvastatin (CRESTOR) 20 MG tablet, Take 20 mg daily by mouth., Disp: , Rfl:   Allergies  Allergen Reactions  . Gabapentin Swelling  . Pneumococcal Vaccine Other (See Comments)    Headache,  Fever and swollen at injection site    Vascular Examination: Capillary refill time <3 seconds x 10 digits Dorsalis pedis and Posterior tibial pulses present b/l No digital hair x 10 digits Skin temperature warm to cool b/l  Dermatological Examination: Skin with normal turgor, texture and tone b/l Toenails 1-5 b/l discolored, thick, dystrophic with subungual debris and pain with palpation to nailbeds due to thickness of nails.  Hyperkeratotic lesion submet head 1 right foot and painful corn right 5th digit with no erythema, no edema, no drainage.  Musculoskeletal: Muscle strength 5/5  to all LE muscle groups  Neurological: Sensation diminished with 10 gram monofilament. Vibratory sensation diminished  Assessment: 1. Painful onychomycosis toenails 1-5 b/l 2. Callus submet head 1 right foot 3. Corn right 5th digit 4. NIDDM with Diabetic neuropathy  Plan: 1. Continue diabetic foot care principles.  2. Toenails 1-5 b/l were debrided in length and girth without iatrogenic bleeding. 3. Hyperkeratotic lesion pared with sterile chisel blade submet head 1 right foot and right 5th digit without incident 4. Patient to continue soft, supportive shoe gear 5. Patient to report any pedal injuries to medical professional  6. Follow up 3 months.  7. Patient/POA to call should there be a concern in the interim.

## 2018-12-17 ENCOUNTER — Ambulatory Visit (INDEPENDENT_AMBULATORY_CARE_PROVIDER_SITE_OTHER): Payer: Medicare Other

## 2018-12-17 ENCOUNTER — Ambulatory Visit (INDEPENDENT_AMBULATORY_CARE_PROVIDER_SITE_OTHER): Payer: Medicare Other | Admitting: Podiatry

## 2018-12-17 ENCOUNTER — Encounter: Payer: Self-pay | Admitting: Podiatry

## 2018-12-17 ENCOUNTER — Other Ambulatory Visit: Payer: Self-pay

## 2018-12-17 VITALS — Temp 98.1°F

## 2018-12-17 DIAGNOSIS — M7751 Other enthesopathy of right foot: Secondary | ICD-10-CM

## 2018-12-17 DIAGNOSIS — M79671 Pain in right foot: Secondary | ICD-10-CM

## 2018-12-17 DIAGNOSIS — M2041 Other hammer toe(s) (acquired), right foot: Secondary | ICD-10-CM | POA: Diagnosis not present

## 2018-12-17 DIAGNOSIS — M2042 Other hammer toe(s) (acquired), left foot: Secondary | ICD-10-CM

## 2018-12-17 DIAGNOSIS — M779 Enthesopathy, unspecified: Secondary | ICD-10-CM

## 2018-12-18 ENCOUNTER — Other Ambulatory Visit: Payer: Self-pay | Admitting: Podiatry

## 2018-12-18 DIAGNOSIS — M2041 Other hammer toe(s) (acquired), right foot: Secondary | ICD-10-CM

## 2018-12-18 NOTE — Progress Notes (Signed)
Subjective:   Patient ID: Tamara Anthony, female   DOB: 78 y.o.   MRN: [id number]   HPI Patient presents stating having a lot of pain on the inside of the second toe left and also has a bump on the right big toe that she is concerned about.  States the toe left has become quite aggravating and she feels like she has structural deformity   ROS      Objective:  Physical Exam  Neurovascular status intact with obvious structural bunion deformity bilaterally deviation of the hallux against the second toe with pressure between the hallux and second digits with inflammatory component noted along with exostosis arthritis of the big toe joint right     Assessment:  Inflammatory capsulitis second digit left hammertoe deformity and arthritis with exostotic lesion hallux right     Plan:  H&P x-rays reviewed and today I went ahead and did a sterile prep and injected the inner phalangeal joint left second digit with 1 mg dexamethasone 1 mg Kenalog 2 mg Xylocaine debrided lesions applied padding and reappoint to recheck  X-rays indicate that there is significant structural malalignment of the lesser digits with severe arthritis interphalangeal joint hallux right over left

## 2019-04-23 ENCOUNTER — Other Ambulatory Visit: Payer: Self-pay

## 2019-04-24 ENCOUNTER — Encounter: Payer: Self-pay | Admitting: Obstetrics & Gynecology

## 2019-04-24 ENCOUNTER — Ambulatory Visit (INDEPENDENT_AMBULATORY_CARE_PROVIDER_SITE_OTHER): Payer: Medicare Other | Admitting: Obstetrics & Gynecology

## 2019-04-24 VITALS — BP 130/70 | Ht 64.75 in | Wt 190.0 lb

## 2019-04-24 DIAGNOSIS — Z01419 Encounter for gynecological examination (general) (routine) without abnormal findings: Secondary | ICD-10-CM

## 2019-04-24 DIAGNOSIS — Z780 Asymptomatic menopausal state: Secondary | ICD-10-CM

## 2019-04-24 DIAGNOSIS — Z6831 Body mass index (BMI) 31.0-31.9, adult: Secondary | ICD-10-CM

## 2019-04-24 DIAGNOSIS — Z90710 Acquired absence of both cervix and uterus: Secondary | ICD-10-CM

## 2019-04-24 DIAGNOSIS — E6609 Other obesity due to excess calories: Secondary | ICD-10-CM

## 2019-04-24 NOTE — Progress Notes (Signed)
Tamara Anthony 28-Jan-1941 [id number]   History:    78 y.o. G2P2L2 Married  RP:  Established patient presenting for annual gyn exam   HPI: Status post vaginal hysterectomy with anterior repair.  Menopause, well on no hormone replacement therapy.  No pelvic pain.  Abstinent.  Urine and bowel movements normal.  Breasts normal.  Body mass index 31.86.  Health labs with family physician.  Past medical history,surgical history, family history and social history were all reviewed and documented in the EPIC chart.  Gynecologic History No LMP recorded. Patient has had a hysterectomy.  Obstetric History OB History  Gravida Para Term Preterm AB Living  2 2 2     2   SAB TAB Ectopic Multiple Live Births          2    # Outcome Date GA Lbr Len/2nd Weight Sex Delivery Anes PTL Lv  2 Term     F Vag-Spont  N LIV  1 Term     F Vag-Spont  N LIV     ROS: A ROS was performed and pertinent positives and negatives are included in the history.  GENERAL: No fevers or chills. HEENT: No change in vision, no earache, sore throat or sinus congestion. NECK: No pain or stiffness. CARDIOVASCULAR: No chest pain or pressure. No palpitations. PULMONARY: No shortness of breath, cough or wheeze. GASTROINTESTINAL: No abdominal pain, nausea, vomiting or diarrhea, melena or bright red blood per rectum. GENITOURINARY: No urinary frequency, urgency, hesitancy or dysuria. MUSCULOSKELETAL: No joint or muscle pain, no back pain, no recent trauma. DERMATOLOGIC: No rash, no itching, no lesions. ENDOCRINE: No polyuria, polydipsia, no heat or cold intolerance. No recent change in weight. HEMATOLOGICAL: No anemia or easy bruising or bleeding. NEUROLOGIC: No headache, seizures, numbness, tingling or weakness. PSYCHIATRIC: No depression, no loss of interest in normal activity or change in sleep pattern.     Exam:   BP 130/70   Ht 5' 4.75" (1.645 m)   Wt 190 lb (86.2 kg)   BMI 31.86 kg/m   Body mass index is 31.86  kg/m.  General appearance : Well developed well nourished female. No acute distress HEENT: Eyes: no retinal hemorrhage or exudates,  Neck supple, trachea midline, no carotid bruits, no thyroidmegaly Lungs: Clear to auscultation, no rhonchi or wheezes, or rib retractions  Heart: Regular rate and rhythm, no murmurs or gallops Breast:Examined in sitting and supine position were symmetrical in appearance, no palpable masses or tenderness,  no skin retraction, no nipple inversion, no nipple discharge, no skin discoloration, no axillary or supraclavicular lymphadenopathy Abdomen: no palpable masses or tenderness, no rebound or guarding Extremities: no edema or skin discoloration or tenderness  Pelvic: Vulva: Normal             Vagina: No gross lesions or discharge  Cervix/Uterus absent  Adnexa  Without masses or tenderness  Anus: Normal   Assessment/Plan:  78 y.o. female for annual exam   1. Well female exam with routine gynecological exam Gynecologic exam status post hysterectomy and menopause.  Pap test in November 2018 was negative, no indication to repeat this year.  Breast exam normal, screening mammogram February 2020 was negative.  Colonoscopy 2018.  Health labs with family physician.  2. S/P vaginal hysterectomy  3. Postmenopausal Well on no hormone replacement therapy.  Bone density in 2017 was normal, will repeat at 5 years.  Vitamin D supplements, calcium intake of 1200 mg daily and regular weightbearing physical activity is recommended.  4.  Class 1 obesity due to excess calories without serious comorbidity with body mass index (BMI) of 31.0 to 31.9 in adult Recommend a mildly lower calorie/carb diet such as [employer].  Aerobic physical activities 5 times a week and weightlifting every 2 days.   Princess Bruins MD, 10:22 AM 04/24/2019

## 2019-04-24 NOTE — Patient Instructions (Signed)
1. Well female exam with routine gynecological exam Gynecologic exam status post hysterectomy and menopause.  Pap test in November 2018 was negative, no indication to repeat this year.  Breast exam normal, screening mammogram February 2020 was negative.  Colonoscopy 2018.  Health labs with family physician.  2. S/P vaginal hysterectomy  3. Postmenopausal Well on no hormone replacement therapy.  Bone density in 2017 was normal, will repeat at 5 years.  Vitamin D supplements, calcium intake of 1200 mg daily and regular weightbearing physical activity is recommended.  4. Class 1 obesity due to excess calories without serious comorbidity with body mass index (BMI) of 31.0 to 31.9 in adult Recommend a mildly lower calorie/carb diet such as [employer].  Aerobic physical activities 5 times a week and weightlifting every 2 days.  Ayleth, it was a pleasure seeing you today!

## 2019-05-20 ENCOUNTER — Other Ambulatory Visit: Payer: Self-pay | Admitting: Obstetrics & Gynecology

## 2019-05-20 DIAGNOSIS — Z1231 Encounter for screening mammogram for malignant neoplasm of breast: Secondary | ICD-10-CM

## 2019-05-31 ENCOUNTER — Ambulatory Visit: Payer: Medicare Other | Attending: Internal Medicine

## 2019-05-31 DIAGNOSIS — Z23 Encounter for immunization: Secondary | ICD-10-CM | POA: Insufficient documentation

## 2019-05-31 NOTE — Progress Notes (Signed)
   Covid-19 Vaccination Clinic  Name:  Tamara Anthony    MRN: [id number] DOB: [birth year]  05/31/2019  [name] was observed post Covid-19 immunization for 15 mins  without incidence. She was provided with Vaccine Information Sheet and instruction to access the V-Safe system.   [name] was instructed to call 911 with any severe reactions post vaccine: Marland Kitchen Difficulty breathing  . Swelling of your face and throat  . A fast heartbeat  . A bad rash all over your body  . Dizziness and weakness

## 2019-06-17 ENCOUNTER — Ambulatory Visit: Payer: Medicare Other | Attending: Internal Medicine

## 2019-06-17 DIAGNOSIS — Z23 Encounter for immunization: Secondary | ICD-10-CM | POA: Insufficient documentation

## 2019-06-17 NOTE — Progress Notes (Signed)
   Covid-19 Vaccination Clinic  Name:  Tamara Anthony    MRN: [id number] DOB: 1941/02/25  06/17/2019  Ms. [name] was observed post Covid-19 immunization for 15 minutes without incidence. She was provided with Vaccine Information Sheet and instruction to access the V-Safe system.   Ms. [name] was instructed to call 911 with any severe reactions post vaccine: Marland Kitchen Difficulty breathing  . Swelling of your face and throat  . A fast heartbeat  . A bad rash all over your body  . Dizziness and weakness    Immunizations Administered    Name Date Dose VIS Date Route   Pfizer COVID-19 Vaccine 06/17/2019 10:15 AM 0.3 mL 04/24/2019 Intramuscular   Manufacturer: [place]   Lot: [id number]   [place]: [id number]

## 2019-06-26 ENCOUNTER — Other Ambulatory Visit: Payer: Self-pay

## 2019-06-26 ENCOUNTER — Ambulatory Visit
Admission: RE | Admit: 2019-06-26 | Discharge: 2019-06-26 | Disposition: A | Discharge status: Home / Self Care | Payer: Medicare Other | Source: Ambulatory Visit | Attending: Obstetrics & Gynecology | Admitting: Obstetrics & Gynecology

## 2019-06-26 DIAGNOSIS — Z1231 Encounter for screening mammogram for malignant neoplasm of breast: Secondary | ICD-10-CM

## 2019-12-10 ENCOUNTER — Ambulatory Visit (INDEPENDENT_AMBULATORY_CARE_PROVIDER_SITE_OTHER): Payer: Medicare Other | Admitting: Nurse Practitioner

## 2019-12-10 ENCOUNTER — Other Ambulatory Visit: Payer: Self-pay

## 2019-12-10 ENCOUNTER — Encounter: Payer: Self-pay | Admitting: Nurse Practitioner

## 2019-12-10 VITALS — BP 134/80

## 2019-12-10 DIAGNOSIS — N3001 Acute cystitis with hematuria: Secondary | ICD-10-CM | POA: Diagnosis not present

## 2019-12-10 DIAGNOSIS — R350 Frequency of micturition: Secondary | ICD-10-CM | POA: Diagnosis not present

## 2019-12-10 MED ORDER — NITROFURANTOIN MONOHYD MACRO 100 MG PO CAPS: 100.0000 mg | ORAL_CAPSULE | Freq: Two times a day (BID) | ORAL | 0 refills | Status: AC

## 2019-12-10 NOTE — Patient Instructions (Signed)
Urinary Tract Infection, Adult A urinary tract infection (UTI) is an infection of any part of the urinary tract. The urinary tract includes:  The kidneys.  The ureters.  The bladder.  The urethra. These organs make, store, and get rid of pee (urine) in the body. What are the causes? This is caused by germs (bacteria) in your genital area. These germs grow and cause swelling (inflammation) of your urinary tract. What increases the risk? You are more likely to develop this condition if:  You have a small, thin tube (catheter) to drain pee.  You cannot control when you pee or poop (incontinence).  You are female, and: ? You use these methods to prevent pregnancy:  A medicine that kills sperm (spermicide).  A device that blocks sperm (diaphragm). ? You have low levels of a female hormone (estrogen). ? You are pregnant.  You have genes that add to your risk.  You are sexually active.  You take antibiotic medicines.  You have trouble peeing because of: ? A prostate that is bigger than normal, if you are female. ? A blockage in the part of your body that drains pee from the bladder (urethra). ? A kidney stone. ? A nerve condition that affects your bladder (neurogenic bladder). ? Not getting enough to drink. ? Not peeing often enough.  You have other conditions, such as: ? Diabetes. ? A weak disease-fighting system (immune system). ? Sickle cell disease. ? Gout. ? Injury of the spine. What are the signs or symptoms? Symptoms of this condition include:  Needing to pee right away (urgently).  Peeing often.  Peeing small amounts often.  Pain or burning when peeing.  Blood in the pee.  Pee that smells bad or not like normal.  Trouble peeing.  Pee that is cloudy.  Fluid coming from the vagina, if you are female.  Pain in the belly or lower back. Other symptoms include:  Throwing up (vomiting).  No urge to eat.  Feeling mixed up (confused).  Being tired  and grouchy (irritable).  A fever.  Watery poop (diarrhea). How is this treated? This condition may be treated with:  Antibiotic medicine.  Other medicines.  Drinking enough water. Follow these instructions at home:  Medicines  Take over-the-counter and prescription medicines only as told by your doctor.  If you were prescribed an antibiotic medicine, take it as told by your doctor. Do not stop taking it even if you start to feel better. General instructions  Make sure you: ? Pee until your bladder is empty. ? Do not hold pee for a long time. ? Empty your bladder after sex. ? Wipe from front to back after pooping if you are a female. Use each tissue one time when you wipe.  Drink enough fluid to keep your pee pale yellow.  Keep all follow-up visits as told by your doctor. This is important. Contact a doctor if:  You do not get better after 1-2 days.  Your symptoms go away and then come back. Get help right away if:  You have very bad back pain.  You have very bad pain in your lower belly.  You have a fever.  You are sick to your stomach (nauseous).  You are throwing up. Summary  A urinary tract infection (UTI) is an infection of any part of the urinary tract.  This condition is caused by germs in your genital area.  There are many risk factors for a UTI. These include having a small, thin   tube to drain pee and not being able to control when you pee or poop.  Treatment includes antibiotic medicines for germs.  Drink enough fluid to keep your pee pale yellow. This information is not intended to replace advice given to you by your health care provider. Make sure you discuss any questions you have with your health care provider. Document Revised: 04/17/2018 Document Reviewed: 11/07/2017 Elsevier Patient Education  2020 Elsevier Inc.  

## 2019-12-10 NOTE — Progress Notes (Signed)
   Acute Office Visit  Subjective:    Patient ID: Tamara Anthony, female    DOB: 1941-02-05, 79 y.o.   MRN: [id number]   HPI 79 y.o. presents today for urinary frequency and lower abdominal pressure that started one week ago. Denies hematuria, dysuria, back pain, or fever.    Review of Systems  Constitutional: Negative.   Gastrointestinal: Negative.   Genitourinary: Positive for frequency and pelvic pain (pressure). Negative for difficulty urinating, dysuria, flank pain, hematuria and urgency.       Objective:    Physical Exam Constitutional:      Appearance: Normal appearance.  Abdominal:     Tenderness: There is no right CVA tenderness or left CVA tenderness.     BP (!) 134/80 (BP Location: Right Arm, Patient Position: Sitting, Cuff Size: Normal)  Wt Readings from Last 3 Encounters:  04/24/19 190 lb (86.2 kg)  04/22/18 188 lb (85.3 kg)  04/10/17 182 lb (82.6 kg)   UA: 3+ blood, 2+ leukocytes, wbc >60, rbc 40-60, moderate bacteria.      Assessment & Plan:   Problem List Items Addressed This Visit    None    Visit Diagnoses    Urinary frequency    -  Primary   Relevant Orders   Urinalysis,Complete w/RFL Culture   Acute cystitis with hematuria       Relevant Medications   nitrofurantoin, macrocrystal-monohydrate, (MACROBID) 100 MG capsule     Plan: Macrobid 100 mg twice a day for 7 days.  Instructed to finish full course of antibiotics even if symptoms improve.  Urine culture pending.  Increase fluid intake.  Follow-up if symptoms worsen or do not improve.     Tamela Gammon [facility], 2:40 PM 12/10/2019

## 2019-12-12 LAB — URINALYSIS, COMPLETE W/RFL CULTURE
Bilirubin Urine: NEGATIVE
Glucose, UA: NEGATIVE
Hyaline Cast: NONE SEEN /LPF
Ketones, ur: NEGATIVE
Nitrites, Initial: NEGATIVE
Specific Gravity, Urine: 1.015 (ref 1.001–1.03)
WBC, UA: >=60 /HPF — AB (ref 0–5)
pH: 5.5 (ref 5.0–8.0)

## 2019-12-12 LAB — URINE CULTURE
MICRO NUMBER:: 10765077
Result:: NO GROWTH
SPECIMEN QUALITY:: ADEQUATE

## 2019-12-12 LAB — CULTURE INDICATED

## 2020-05-05 ENCOUNTER — Other Ambulatory Visit: Payer: Self-pay | Admitting: Obstetrics & Gynecology

## 2020-05-05 ENCOUNTER — Other Ambulatory Visit: Payer: Self-pay

## 2020-05-05 ENCOUNTER — Ambulatory Visit (INDEPENDENT_AMBULATORY_CARE_PROVIDER_SITE_OTHER): Payer: Medicare Other | Admitting: Obstetrics & Gynecology

## 2020-05-05 ENCOUNTER — Encounter: Payer: Self-pay | Admitting: Obstetrics & Gynecology

## 2020-05-05 VITALS — BP 130/70 | Ht 64.5 in | Wt 192.2 lb

## 2020-05-05 DIAGNOSIS — Z13820 Encounter for screening for osteoporosis: Secondary | ICD-10-CM

## 2020-05-05 DIAGNOSIS — Z1231 Encounter for screening mammogram for malignant neoplasm of breast: Secondary | ICD-10-CM

## 2020-05-05 DIAGNOSIS — Z780 Asymptomatic menopausal state: Secondary | ICD-10-CM

## 2020-05-05 DIAGNOSIS — Z6832 Body mass index (BMI) 32.0-32.9, adult: Secondary | ICD-10-CM

## 2020-05-05 DIAGNOSIS — E6609 Other obesity due to excess calories: Secondary | ICD-10-CM

## 2020-05-05 DIAGNOSIS — Z01419 Encounter for gynecological examination (general) (routine) without abnormal findings: Secondary | ICD-10-CM

## 2020-05-05 DIAGNOSIS — Z90710 Acquired absence of both cervix and uterus: Secondary | ICD-10-CM

## 2020-05-05 NOTE — Progress Notes (Signed)
Tamara Anthony 20-Nov-1940 [id number]   History:    79 y.o.  G2P2L2 Married  [id number] presenting for annual gyn exam   ACZ:YSAYTK post vaginal hysterectomy with anterior repair. Menopause, well on no hormone replacement therapy. No pelvic pain. Abstinent. Urine and bowel movements normal. Breasts normal. Body mass index 32. 48. Health labs with family physician.  Colono 2018.  Past medical history,surgical history, family history and social history were all reviewed and documented in the EPIC chart.  Gynecologic History No LMP recorded. Patient has had a hysterectomy.  Obstetric History OB History  Gravida Para Term Preterm AB Living  2 2 2     2   SAB IAB Ectopic Multiple Live Births          2    # Outcome Date GA Lbr Len/2nd Weight Sex Delivery Anes PTL Lv  2 Term     F Vag-Spont  N LIV  1 Term     F Vag-Spont  N LIV     ROS: A ROS was performed and pertinent positives and negatives are included in the history.  GENERAL: No fevers or chills. HEENT: No change in vision, no earache, sore throat or sinus congestion. NECK: No pain or stiffness. CARDIOVASCULAR: No chest pain or pressure. No palpitations. PULMONARY: No shortness of breath, cough or wheeze. GASTROINTESTINAL: No abdominal pain, nausea, vomiting or diarrhea, melena or bright red blood per rectum. GENITOURINARY: No urinary frequency, urgency, hesitancy or dysuria. MUSCULOSKELETAL: No joint or muscle pain, no back pain, no recent trauma. DERMATOLOGIC: No rash, no itching, no lesions. ENDOCRINE: No polyuria, polydipsia, no heat or cold intolerance. No recent change in weight. HEMATOLOGICAL: No anemia or easy bruising or bleeding. NEUROLOGIC: No headache, seizures, numbness, tingling or weakness. PSYCHIATRIC: No depression, no loss of interest in normal activity or change in sleep pattern.     Exam:   BP 130/70   Ht 5' 4.5" (1.638 m)   Wt 192 lb 3.2 oz (87.2 kg)   BMI 32.48 kg/m   Body mass index is  32.48 kg/m.  General appearance : Well developed well nourished female. No acute distress HEENT: Eyes: no retinal hemorrhage or exudates,  Neck supple, trachea midline, no carotid bruits, no thyroidmegaly Lungs: Clear to auscultation, no rhonchi or wheezes, or rib retractions  Heart: Regular rate and rhythm, no murmurs or gallops Breast:Examined in sitting and supine position were symmetrical in appearance, no palpable masses or tenderness,  no skin retraction, no nipple inversion, no nipple discharge, no skin discoloration, no axillary or supraclavicular lymphadenopathy Abdomen: no palpable masses or tenderness, no rebound or guarding Extremities: no edema or skin discoloration or tenderness  Pelvic: Vulva: Normal             Vagina: No gross lesions or discharge  Cervix/Uterus absent  Adnexa  Without masses or tenderness  Anus: Normal   Assessment/Plan:  79 y.o. female for annual exam   1. Well female exam with routine gynecological exam Gynecologic exam status post vaginal hysterectomy in menopause.  No indication for Pap test.  Breast exam normal.  Screening mammogram February 2021 was negative.  Colonoscopy 2018.  Health labs with family physician.  2. S/P vaginal hysterectomy  3. Postmenopausal Well on no hormone replacement therapy.  4. Screening for osteoporosis Screening bone density was normal with a T score of -1.0 in 2017.  We will repeat a bone density at the breast center now.  Vitamin D supplements, calcium intake of 1500 mg daily and  regular weightbearing physical activity is recommended. - DG Bone Density; Future  5. Class 1 obesity due to excess calories with serious comorbidity and body mass index (BMI) of 32.0 to 32.9 in adult Recommend a lower calorie/carb diet.  Aerobic activities 5 times a week and light weightlifting every 2 days.  Other orders - Cranberry 200 MG CAPS; Take by mouth.  Princess Bruins MD, 11:35 AM 05/05/2020

## 2020-06-28 ENCOUNTER — Ambulatory Visit: Payer: BLUE CROSS/BLUE SHIELD

## 2020-07-05 ENCOUNTER — Ambulatory Visit: Payer: BLUE CROSS/BLUE SHIELD

## 2020-07-29 ENCOUNTER — Inpatient Hospital Stay: Admission: RE | Admit: 2020-07-29 | Payer: BLUE CROSS/BLUE SHIELD | Source: Ambulatory Visit

## 2020-08-18 ENCOUNTER — Other Ambulatory Visit: Payer: BLUE CROSS/BLUE SHIELD

## 2020-09-20 ENCOUNTER — Other Ambulatory Visit: Payer: Self-pay

## 2020-09-20 ENCOUNTER — Ambulatory Visit
Admission: RE | Admit: 2020-09-20 | Discharge: 2020-09-20 | Disposition: A | Discharge status: Home / Self Care | Payer: Medicare Other | Source: Ambulatory Visit | Attending: Obstetrics & Gynecology | Admitting: Obstetrics & Gynecology

## 2020-09-20 DIAGNOSIS — Z1231 Encounter for screening mammogram for malignant neoplasm of breast: Secondary | ICD-10-CM

## 2021-05-24 ENCOUNTER — Ambulatory Visit (INDEPENDENT_AMBULATORY_CARE_PROVIDER_SITE_OTHER): Payer: Medicare Other | Admitting: Obstetrics & Gynecology

## 2021-05-24 ENCOUNTER — Encounter: Payer: Self-pay | Admitting: Obstetrics & Gynecology

## 2021-05-24 ENCOUNTER — Other Ambulatory Visit: Payer: Self-pay

## 2021-05-24 VITALS — BP 124/70 | HR 83 | Ht 64.5 in | Wt 195.0 lb

## 2021-05-24 DIAGNOSIS — Z780 Asymptomatic menopausal state: Secondary | ICD-10-CM

## 2021-05-24 DIAGNOSIS — Z01419 Encounter for gynecological examination (general) (routine) without abnormal findings: Secondary | ICD-10-CM

## 2021-05-24 DIAGNOSIS — E6609 Other obesity due to excess calories: Secondary | ICD-10-CM

## 2021-05-24 DIAGNOSIS — Z6832 Body mass index (BMI) 32.0-32.9, adult: Secondary | ICD-10-CM

## 2021-05-24 DIAGNOSIS — Z90710 Acquired absence of both cervix and uterus: Secondary | ICD-10-CM

## 2021-05-24 NOTE — Progress Notes (Signed)
Tamara Anthony 1940-10-06 [id number]   History:    81 y.o.  G2P2L2 Married   RP:  Established patient presenting for annual gyn exam    HPI: Status post vaginal hysterectomy with anterior repair.  Postmenopause, well on no hormone replacement therapy.  No pelvic pain.  Abstinent.  Pap Neg 2018.  Urine and bowel movements normal.  Breasts normal. Mammo 09/2020 Neg. Body mass index 32.95.  BD Normal in 2017.  Health labs with family physician.  Colono 2018.    P 03/15/17, M 09/20/20, D 05/11/16?, colon 2018  Past medical history,surgical history, family history and social history were all reviewed and documented in the EPIC chart.  Gynecologic History No LMP recorded. Patient has had a hysterectomy.  Obstetric History OB History  Gravida Para Term Preterm AB Living  2 2 2     2   SAB IAB Ectopic Multiple Live Births          2    # Outcome Date GA Lbr Len/2nd Weight Sex Delivery Anes PTL Lv  2 Term     F Vag-Spont  N LIV  1 Term     F Vag-Spont  N LIV     ROS: A ROS was performed and pertinent positives and negatives are included in the history.  GENERAL: No fevers or chills. HEENT: No change in vision, no earache, sore throat or sinus congestion. NECK: No pain or stiffness. CARDIOVASCULAR: No chest pain or pressure. No palpitations. PULMONARY: No shortness of breath, cough or wheeze. GASTROINTESTINAL: No abdominal pain, nausea, vomiting or diarrhea, melena or bright red blood per rectum. GENITOURINARY: No urinary frequency, urgency, hesitancy or dysuria. MUSCULOSKELETAL: No joint or muscle pain, no back pain, no recent trauma. DERMATOLOGIC: No rash, no itching, no lesions. ENDOCRINE: No polyuria, polydipsia, no heat or cold intolerance. No recent change in weight. HEMATOLOGICAL: No anemia or easy bruising or bleeding. NEUROLOGIC: No headache, seizures, numbness, tingling or weakness. PSYCHIATRIC: No depression, no loss of interest in normal activity or change in sleep pattern.      Exam:   BP 124/70    Pulse 83    Ht 5' 4.5" (1.638 m)    Wt 195 lb (88.5 kg)    SpO2 98%    BMI 32.95 kg/m   Body mass index is 32.95 kg/m.  General appearance : Well developed well nourished female. No acute distress HEENT: Eyes: no retinal hemorrhage or exudates,  Neck supple, trachea midline, no carotid bruits, no thyroidmegaly Lungs: Clear to auscultation, no rhonchi or wheezes, or rib retractions  Heart: Regular rate and rhythm, no murmurs or gallops Breast:Examined in sitting and supine position were symmetrical in appearance, no palpable masses or tenderness,  no skin retraction, no nipple inversion, no nipple discharge, no skin discoloration, no axillary or supraclavicular lymphadenopathy Abdomen: no palpable masses or tenderness, no rebound or guarding Extremities: no edema or skin discoloration or tenderness  Pelvic: Vulva: Normal             Vagina: No gross lesions or discharge  Cervix/Uterus absent  Adnexa  Without masses or tenderness  Anus: Normal   Assessment/Plan:  81 y.o. female for annual exam   1. Well female exam with routine gynecological exam Status post vaginal hysterectomy with anterior repair.  Postmenopause, well on no hormone replacement therapy.  No pelvic pain.  Abstinent.  Pap Neg 2018.  Urine and bowel movements normal. Breasts normal. Mammo 09/2020 Neg. Body mass index 32.95.  BD Normal in  2017.  Health labs with family physician.  Colono 2018.  2. S/P vaginal hysterectomy  3. Postmenopausal Status post vaginal hysterectomy with anterior repair.  Postmenopause, well on no hormone replacement therapy.  No pelvic pain.  Abstinent.  BD normal in 2017.  4. Class 1 obesity due to excess calories with serious comorbidity and body mass index (BMI) of 32.0 to 32.9 in adult Low calorie/carb diet.  Increase fitness activities.  Other orders - hydrochlorothiazide (HYDRODIURIL) 25 MG tablet; Take 1 tablet by mouth daily. - Melatonin 10 MG TABS; Take by  mouth. - glucose blood (PRECISION QID TEST) test strip; 1 strip by Misc.(Non-Drug; Combo Route) route daily. Check blood sugar once daily. - Cranberry 200 MG CAPS; Take 1 capsule by mouth daily. - Blood Glucose Monitoring Suppl (GLUCOCOM BLOOD GLUCOSE MONITOR) DEVI; 1 Device by Misc.(Non-Drug; Combo Route) route daily. - benazepril (LOTENSIN) 40 MG tablet; Take 1 tablet by mouth daily. - aspirin 81 MG EC tablet; Take by mouth.   Princess Bruins MD, 3:12 PM 05/24/2021

## 2021-08-21 ENCOUNTER — Other Ambulatory Visit: Payer: Self-pay | Admitting: Obstetrics & Gynecology

## 2021-08-21 DIAGNOSIS — Z1231 Encounter for screening mammogram for malignant neoplasm of breast: Secondary | ICD-10-CM

## 2021-09-22 ENCOUNTER — Ambulatory Visit
Admission: RE | Admit: 2021-09-22 | Discharge: 2021-09-22 | Disposition: A | Discharge status: Home / Self Care | Payer: Medicare Other | Source: Ambulatory Visit | Attending: Obstetrics & Gynecology | Admitting: Obstetrics & Gynecology

## 2021-09-22 DIAGNOSIS — Z1231 Encounter for screening mammogram for malignant neoplasm of breast: Secondary | ICD-10-CM

## 2022-08-23 ENCOUNTER — Other Ambulatory Visit: Payer: Self-pay | Admitting: Obstetrics & Gynecology

## 2022-08-23 DIAGNOSIS — Z1231 Encounter for screening mammogram for malignant neoplasm of breast: Secondary | ICD-10-CM

## 2022-10-03 ENCOUNTER — Ambulatory Visit: Payer: Medicare Other

## 2022-10-24 ENCOUNTER — Ambulatory Visit
Admission: RE | Admit: 2022-10-24 | Discharge: 2022-10-24 | Disposition: A | Discharge status: Home / Self Care | Payer: Medicare Other | Source: Ambulatory Visit | Attending: Obstetrics & Gynecology | Admitting: Obstetrics & Gynecology

## 2022-10-24 DIAGNOSIS — Z1231 Encounter for screening mammogram for malignant neoplasm of breast: Secondary | ICD-10-CM

## 2023-07-10 ENCOUNTER — Encounter: Payer: Self-pay | Admitting: Obstetrics and Gynecology

## 2023-07-10 ENCOUNTER — Ambulatory Visit (INDEPENDENT_AMBULATORY_CARE_PROVIDER_SITE_OTHER): Payer: Medicare Other | Admitting: Obstetrics and Gynecology

## 2023-07-10 VITALS — BP 124/72 | HR 87 | Temp 98.7°F | Wt 182.0 lb

## 2023-07-10 DIAGNOSIS — B3731 Acute candidiasis of vulva and vagina: Secondary | ICD-10-CM | POA: Diagnosis not present

## 2023-07-10 DIAGNOSIS — N898 Other specified noninflammatory disorders of vagina: Secondary | ICD-10-CM

## 2023-07-10 LAB — WET PREP FOR TRICH, YEAST, CLUE

## 2023-07-10 MED ORDER — FLUCONAZOLE 150 MG PO TABS: 150.0000 mg | ORAL_TABLET | ORAL | 1 refills | Status: AC

## 2023-07-10 NOTE — Progress Notes (Signed)
 83 y.o. [id number] female SP hysterectomy here for vaginal irritation. Married.  No LMP recorded. Patient has had a hysterectomy.   Pt reports vaginal itching x 5 days, come and goes. Of note, patient type 2 diabetes, most recent A1c 6.8%.   OB History  Gravida Para Term Preterm AB Living  2 2 2   2   SAB IAB Ectopic Multiple Live Births      2    # Outcome Date GA Lbr Len/2nd Weight Sex Type Anes PTL Lv  2 Term     F Vag-Spont  N LIV  1 Term     F Vag-Spont  N LIV    Past Medical History:  Diagnosis Date   Arthritis    Colon polyps    Diabetes mellitus    type 2   Hyperlipidemia    Hypertension    S/P vaginal hysterectomy    03/2012    Past Surgical History:  Procedure Laterality Date   ABDOMINAL HYSTERECTOMY     COLONOSCOPY     Feb 2016   CYSTOCELE REPAIR  03/20/2012   Procedure: ANTERIOR REPAIR (CYSTOCELE);  Surgeon: Ok Edwards, MD;  Location: [facility];  Service: Gynecology;  Laterality: N/A;   MOHS SURGERY     TONSILLECTOMY     age 48   TOTAL HIP ARTHROPLASTY Left 04/10/2017   Procedure: LEFT TOTAL HIP ARTHROPLASTY ANTERIOR APPROACH;  Surgeon: Ollen Gross, MD;  Location: [facility];  Service: Orthopedics;  Laterality: Left;   TOTAL KNEE ARTHROPLASTY Left 08/30/2014   Procedure: TOTAL LEFT KNEE ARTHROPLASTY;  Surgeon: Ollen Gross, MD;  Location: [facility];  Service: Orthopedics;  Laterality: Left;   VAGINAL HYSTERECTOMY  03/20/2012   Procedure: HYSTERECTOMY VAGINAL;  Surgeon: Ok Edwards, MD;  Location: [facility];  Service: Gynecology;  Laterality: N/A;                            Current Outpatient Medications on File Prior to Visit  Medication Sig Dispense Refill   B Complex Vitamins (VITAMIN B-COMPLEX) TABS Take by mouth.     benazepril (LOTENSIN) 40 MG tablet Take 1 tablet by mouth daily.     Blood Glucose Monitoring Suppl (GLUCOCOM BLOOD GLUCOSE MONITOR) DEVI 1 Device by Misc.(Non-Drug; Combo Route) route daily.     carvedilol (COREG CR) 40 MG 24 hr  capsule Take 40 mg by mouth every morning.      Coenzyme Q10 (COQ10 PO) Take by mouth.     Cranberry 200 MG CAPS Take by mouth.     Cranberry 200 MG CAPS Take 1 capsule by mouth daily.     glucose blood (PRECISION QID TEST) test strip 1 strip by Misc.(Non-Drug; Combo Route) route daily. Check blood sugar once daily.     hydrochlorothiazide (HYDRODIURIL) 25 MG tablet Take 1 tablet by mouth daily.     Magnesium 100 MG CAPS magnesium     Melatonin 10 MG TABS Take by mouth.     metFORMIN (GLUCOPHAGE-XR) 500 MG 24 hr tablet Take 1,000 mg by mouth every morning.     Probiotic, Lactobacillus, CAPS Take by mouth.     rosuvastatin (CRESTOR) 20 MG tablet Take 20 mg daily by mouth.     aspirin 81 MG EC tablet Take by mouth. (Patient not taking: Reported on 07/10/2023)     docusate sodium (COLACE) 100 MG capsule Take 200 mg by mouth every other day. (Patient not taking: Reported on  07/10/2023)     JARDIANCE 10 MG TABS tablet Take 10 mg by mouth daily.     polyethylene glycol (MIRALAX / GLYCOLAX) packet Take 17 g every other day by mouth. (Patient not taking: Reported on 07/10/2023)     No current facility-administered medications on file prior to visit.    Allergies  Allergen Reactions   Gabapentin Swelling   Pneumococcal Vaccine Other (See Comments)    Headache,  Fever and swollen at injection site      PE Today's Vitals   07/10/23 1556  BP: 124/72  Pulse: 87  Temp: 98.7 F (37.1 C)  TempSrc: Oral  SpO2: 97%  Weight: 182 lb (82.6 kg)   Body mass index is 30.76 kg/m.  Physical Exam Vitals reviewed. Exam conducted with a chaperone present.  Constitutional:      General: She is not in acute distress.    Appearance: Normal appearance.  HENT:     Head: Normocephalic and atraumatic.     Nose: Nose normal.  Eyes:     Extraocular Movements: Extraocular movements intact.     Conjunctiva/sclera: Conjunctivae normal.  Pulmonary:     Effort: Pulmonary effort is normal.  Genitourinary:     Exam position: Lithotomy position.     Labia:        Right: Rash present.        Left: Rash present.      Vagina: Normal.     Uterus: Absent.      Adnexa: Right adnexa normal and left adnexa normal.     Comments: Erythema bilateral vulva.  Minimal discharge.  Uterus and cervix after Musculoskeletal:        General: Normal range of motion.     Cervical back: Normal range of motion.  Neurological:     General: No focal deficit present.     Mental Status: She is alert.  Psychiatric:        Mood and Affect: Mood normal.        Behavior: Behavior normal.   ProblemAssessment and Plan:        Vaginal irritation -     WET PREP FOR TRICH, YEAST, CLUE  Yeast vaginitis -     Fluconazole; Take 1 tablet (150 mg total) by mouth every 3 (three) days for 3 doses.  Dispense: 3 tablet; Refill: 1   Vaginal hygiene reviewed, including avoiding scented soaps, lotions, and menstrual products, perfumes, and douching and applying any products directly into the vagina.  Rosalyn Gess, MD

## 2023-07-10 NOTE — Patient Instructions (Signed)
 Avoid scented soaps, lotions, and menstrual products, perfumes. Also avoid douching and applying any products directly into the vagina.

## 2023-09-16 ENCOUNTER — Other Ambulatory Visit: Payer: Self-pay | Admitting: Family Medicine

## 2023-09-16 DIAGNOSIS — Z1231 Encounter for screening mammogram for malignant neoplasm of breast: Secondary | ICD-10-CM

## 2023-10-28 ENCOUNTER — Ambulatory Visit
Admission: RE | Admit: 2023-10-28 | Discharge: 2023-10-28 | Disposition: A | Discharge status: Home / Self Care | Source: Ambulatory Visit | Attending: Family Medicine | Admitting: Family Medicine

## 2023-10-28 DIAGNOSIS — Z1231 Encounter for screening mammogram for malignant neoplasm of breast: Secondary | ICD-10-CM

## 2023-12-08 IMAGING — MG MM DIGITAL SCREENING BILAT W/ TOMO AND CAD
8 series · 8 of 24 positions shown · non-contrast
Comparison: Previous exam(s).

CLINICAL DATA: Screening.

EXAM:
DIGITAL SCREENING BILATERAL MAMMOGRAM WITH TOMOSYNTHESIS AND CAD
TECHNIQUE: Bilateral screening digital craniocaudal and mediolateral oblique
mammograms were obtained. Bilateral screening digital breast
tomosynthesis was performed. The images were evaluated with
computer-aided detection.

[L CC synth-2D]
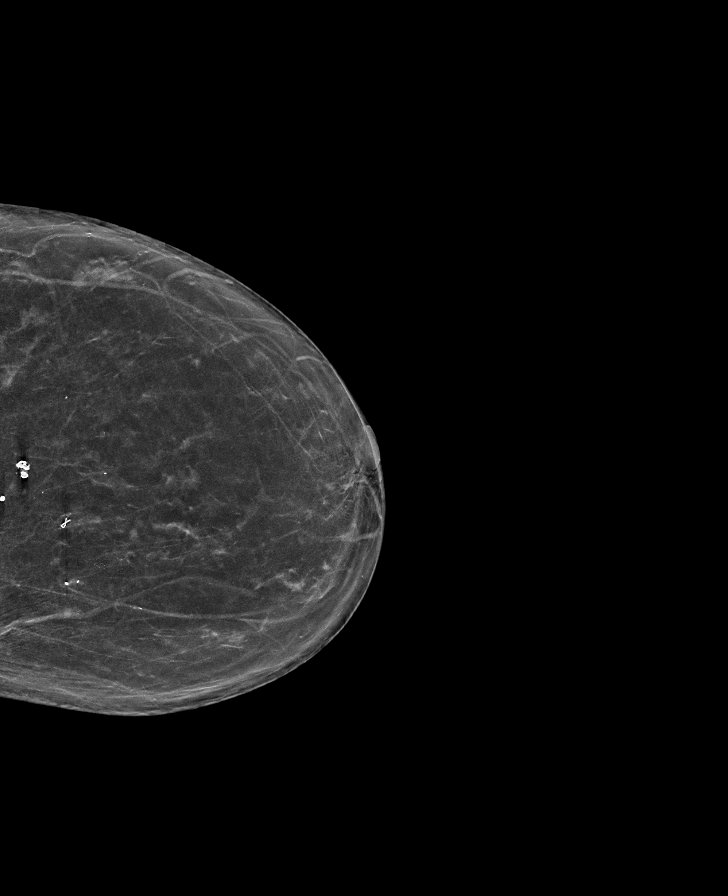

[R MLO synth-2D]
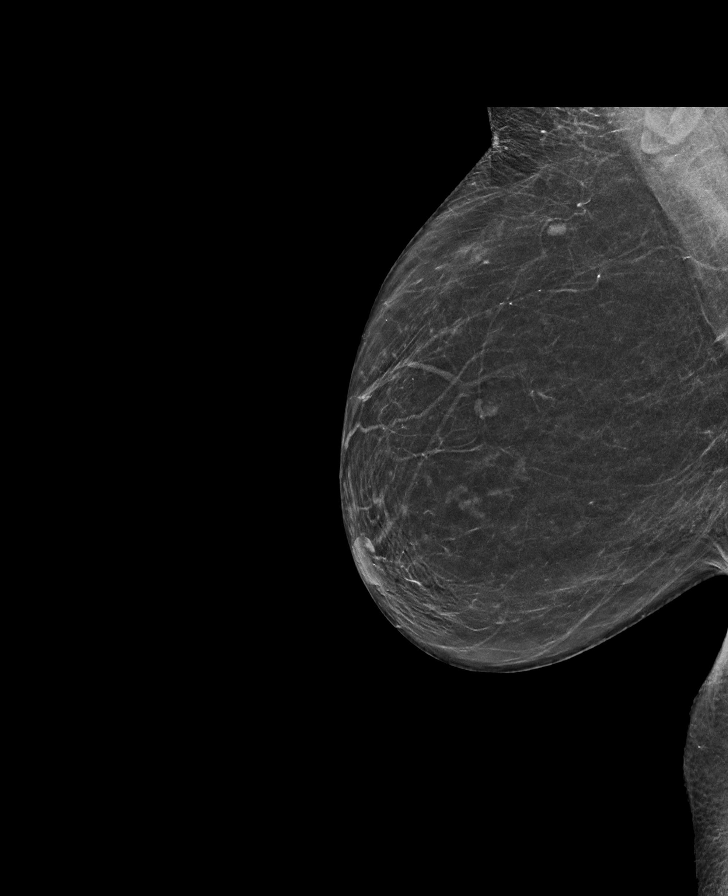

[L MLO synth-2D]
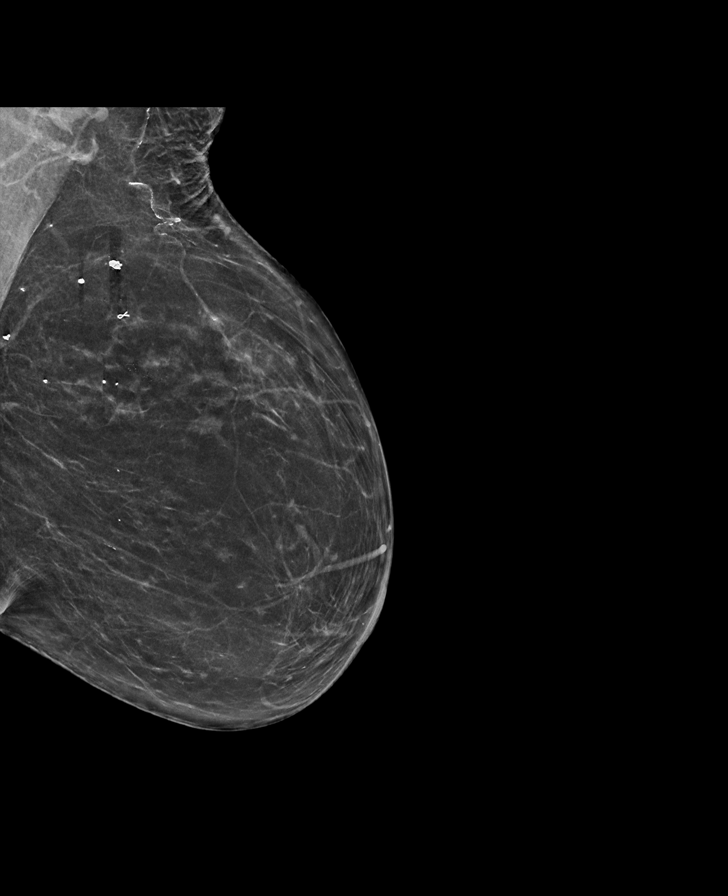

[R CC synth-2D]
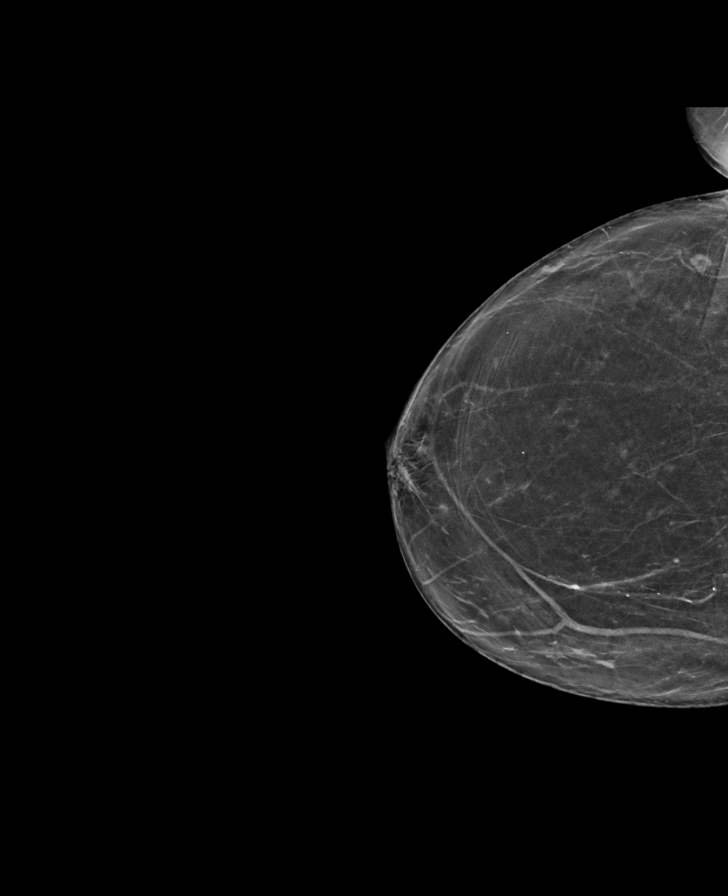

[L CC tomo · tomo slice 31/61.0]
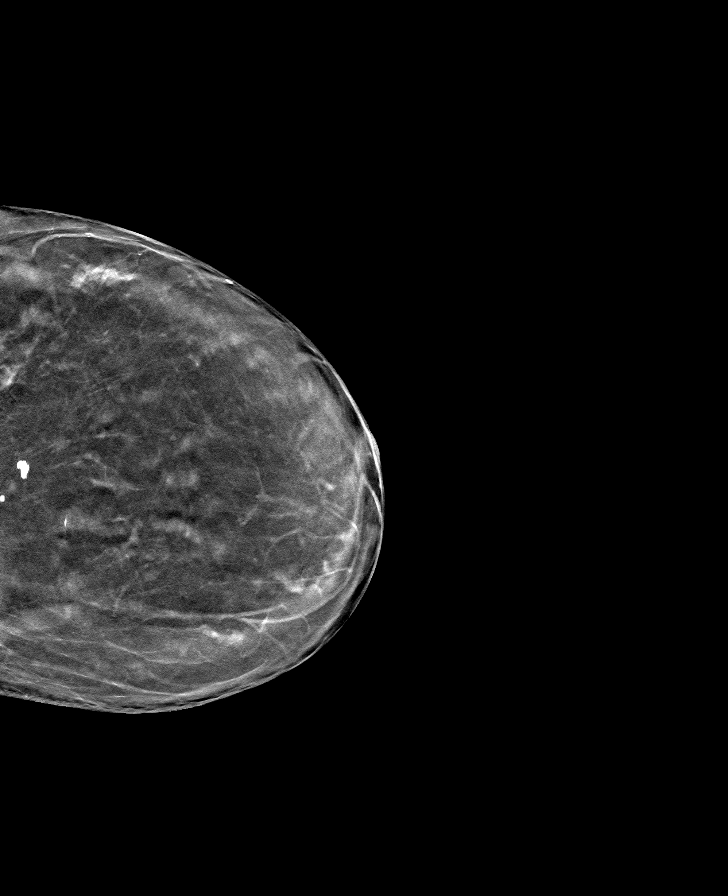

[R CC tomo · tomo slice 31/60.0]
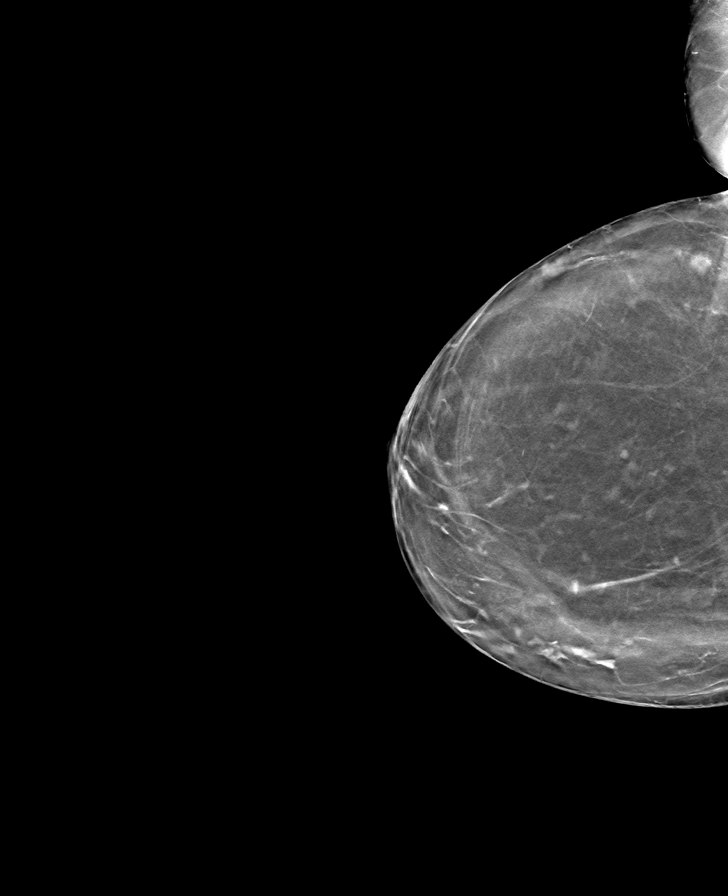

[R MLO tomo · tomo slice 31/61.0]
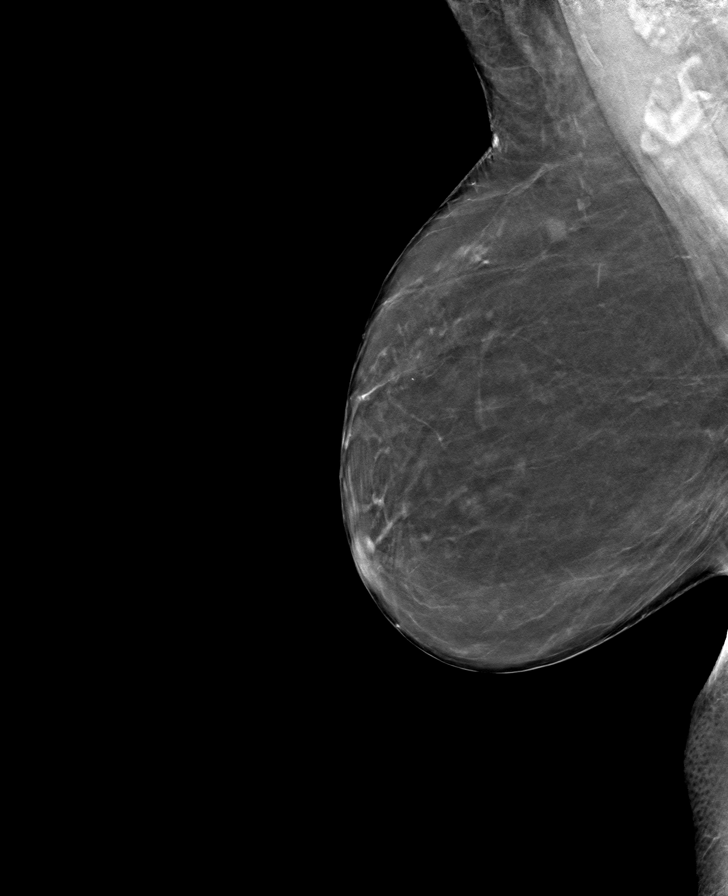

[L MLO tomo · tomo slice 33/65.0]
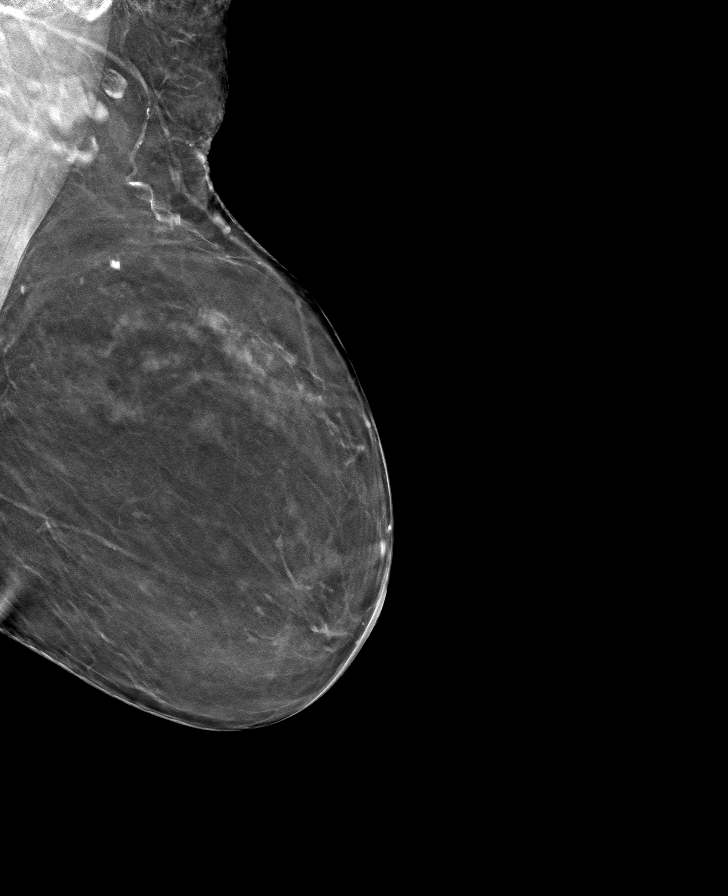

[8 of 24 positions shown; findings below may reference images not displayed]

ACR Breast Density Category b: There are scattered areas of
fibroglandular density.
FINDINGS: There are no findings suspicious for malignancy.
IMPRESSION: No mammographic evidence of malignancy. A result letter of this
screening mammogram will be mailed directly to the patient.

RECOMMENDATION:
Screening mammogram in one year. (Code:51-O-LD2)

BI-RADS CATEGORY  1: Negative.

## 2023-12-26 ENCOUNTER — Emergency Department (HOSPITAL_COMMUNITY)

## 2023-12-26 ENCOUNTER — Emergency Department (HOSPITAL_COMMUNITY)
Admission: EM | Admit: 2023-12-26 | Discharge: 2023-12-26 | Disposition: A | Discharge status: Home / Self Care | Attending: Emergency Medicine | Admitting: Emergency Medicine

## 2023-12-26 DIAGNOSIS — W1830XA Fall on same level, unspecified, initial encounter: Secondary | ICD-10-CM | POA: Insufficient documentation

## 2023-12-26 DIAGNOSIS — W19XXXA Unspecified fall, initial encounter: Secondary | ICD-10-CM

## 2023-12-26 DIAGNOSIS — S0990XA Unspecified injury of head, initial encounter: Secondary | ICD-10-CM | POA: Diagnosis present

## 2023-12-26 DIAGNOSIS — Y92009 Unspecified place in unspecified non-institutional (private) residence as the place of occurrence of the external cause: Secondary | ICD-10-CM | POA: Insufficient documentation

## 2023-12-26 DIAGNOSIS — S0083XA Contusion of other part of head, initial encounter: Secondary | ICD-10-CM | POA: Insufficient documentation

## 2023-12-26 DIAGNOSIS — E860 Dehydration: Secondary | ICD-10-CM | POA: Diagnosis not present

## 2023-12-26 LAB — COMPREHENSIVE METABOLIC PANEL WITH GFR
ALT: 21 U/L (ref 0–44)
AST: 28 U/L (ref 15–41)
Albumin: 3.6 g/dL (ref 3.5–5.0)
Alkaline Phosphatase: 42 U/L (ref 38–126)
Anion gap: 13 (ref 5–15)
BUN: 23 mg/dL (ref 8–23)
CO2: 20 mmol/L — ABNORMAL LOW (ref 22–32)
Calcium: 9.8 mg/dL (ref 8.9–10.3)
Chloride: 104 mmol/L (ref 98–111)
Creatinine, Ser: 1.11 mg/dL — ABNORMAL HIGH (ref 0.44–1.00)
GFR, Estimated: 50 mL/min — ABNORMAL LOW (ref >60)
Glucose, Bld: 150 mg/dL — ABNORMAL HIGH (ref 70–99)
Potassium: 4.1 mmol/L (ref 3.5–5.1)
Sodium: 137 mmol/L (ref 135–145)
Total Bilirubin: 0.7 mg/dL (ref 0.0–1.2)
Total Protein: 6.5 g/dL (ref 6.5–8.1)

## 2023-12-26 LAB — CBC
HCT: 43.1 % (ref 36.0–46.0)
Hemoglobin: 14.1 g/dL (ref 12.0–15.0)
MCH: 29.3 pg (ref 26.0–34.0)
MCHC: 32.7 g/dL (ref 30.0–36.0)
MCV: 89.6 fL (ref 80.0–100.0)
Platelets: 236 K/uL (ref 150–400)
RBC: 4.81 MIL/uL (ref 3.87–5.11)
RDW: 12.7 % (ref 11.5–15.5)
WBC: 9.2 K/uL (ref 4.0–10.5)
nRBC: 0 % (ref 0.0–0.2)

## 2023-12-26 LAB — PROTIME-INR
INR: 0.9 (ref 0.8–1.2)
Prothrombin Time: 12.7 s (ref 11.4–15.2)

## 2023-12-26 MED ORDER — SODIUM CHLORIDE 0.9 % IV BOLUS
1000.0000 mL | Freq: Once | INTRAVENOUS | Status: AC
  Administered 2023-12-26: 1000 mL via INTRAVENOUS

## 2023-12-26 NOTE — ED Triage Notes (Addendum)
 Pt BIB [facility] after fall at home, near syncope while standing from couch and fell face forward, hitting head on floor. No current dizziness. Not on thinners. EMS c collar in place at this time.  VS BP 186/100, HR 94 NSR, 97% O2, 146 CBG

## 2023-12-26 NOTE — ED Notes (Signed)
..  The patient is A&OX4, pt wheeled out of ED via wheelchair, no acute distress. Pt verbalized understanding of d/c instructions and follow up care.

## 2023-12-26 NOTE — Discharge Instructions (Signed)
 Be sure to stay well-hydrated and monitor your condition carefully.  Do not hesitate to return here for concerning changes in your condition.

## 2023-12-26 NOTE — ED Provider Notes (Signed)
  EMERGENCY DEPARTMENT AT [facility] Provider Note   CSN: [id number] Arrival date & time: 12/26/23  1621     Patient presents with: Tamara Anthony is a 83 y.o. female.   HPI Patient presents after episode of syncope and fall.  She was in her usual state of health at home before the event.  She recalls standing up, feeling lightheaded, falling and having a period of unresponsiveness. She woke with facial pain, headache, but no focal weakness. Currently no neck pain, no extremity pain, no hip pain.  She does have pain in her face, head she was in her usual state of affairs prior to the event, has been taking her medication as directed, does not take blood thinning medication.    Prior to Admission medications   Medication Sig Start Date End Date Taking? Authorizing Provider  B Complex Vitamins (VITAMIN B-COMPLEX) TABS Take by mouth.    [provider]  benazepril (LOTENSIN) 40 MG tablet Take 1 tablet by mouth daily. 04/25/21   [provider]  Blood Glucose Monitoring Suppl (GLUCOCOM BLOOD GLUCOSE MONITOR) DEVI 1 Device by Misc.(Non-Drug; Combo Route) route daily. 04/15/19   [provider]  carvedilol  (COREG  CR) 40 MG 24 hr capsule Take 40 mg by mouth every morning.     [provider]  Coenzyme Q10 (COQ10 PO) Take by mouth.    [provider]  Cranberry 200 MG CAPS Take by mouth.    [provider]  Cranberry 200 MG CAPS Take 1 capsule by mouth daily.    [provider]  glucose blood (PRECISION QID TEST) test strip 1 strip by Misc.(Non-Drug; Combo Route) route daily. Check blood sugar once daily. 04/15/19   [provider]  hydrochlorothiazide  (HYDRODIURIL ) 25 MG tablet Take 1 tablet by mouth daily. 10/12/20   [provider]  JARDIANCE 10 MG TABS tablet Take 10 mg by mouth daily.    [provider]  Magnesium 100 MG CAPS magnesium    [provider]  Melatonin 10 MG  TABS Take by mouth.    [provider]  metFORMIN  (GLUCOPHAGE -XR) 500 MG 24 hr tablet Take 1,000 mg by mouth every morning.    [provider]  Probiotic, Lactobacillus, CAPS Take by mouth. 04/17/22   [provider]  rosuvastatin  (CRESTOR ) 20 MG tablet Take 20 mg daily by mouth.    [provider]    Allergies: Gabapentin and Pneumococcal vaccine    Review of Systems  Updated Vital Signs BP (!) 179/87   Pulse 88   Temp 97.8 F (36.6 C) (Oral)   Resp (!) 21   Ht 5' 5 (1.651 m)   Wt 78 kg   SpO2 99%   BMI 28.62 kg/m   Physical Exam Vitals and nursing note reviewed.  Constitutional:      General: She is not in acute distress.    Appearance: She is well-developed.  HENT:     Head: Normocephalic.      Mouth/Throat:   Eyes:     Conjunctiva/sclera: Conjunctivae normal.  Neck:     Comments: Cervical collar in place, no gross abnormalities Cardiovascular:     Rate and Rhythm: Normal rate and regular rhythm.  Pulmonary:     Effort: Pulmonary effort is normal. No respiratory distress.     Breath sounds: Normal breath sounds. No stridor.  Abdominal:     General: There is no distension.  Musculoskeletal:     Comments:  Pelvis stable, patient flexes each hip to command.  Skin:    General: Skin is warm and dry.  Neurological:     Mental Status: She is alert and oriented to person, place, and time.     Cranial Nerves: No cranial nerve deficit.     Comments: Age-appropriate atrophy otherwise extremity strength 5/5 throughout symmetric  Psychiatric:        Mood and Affect: Mood normal.     (all labs ordered are listed, but only abnormal results are displayed) Labs Reviewed  COMPREHENSIVE METABOLIC PANEL WITH GFR - Abnormal; Notable for the following components:      Result Value   CO2 20 (*)    Glucose, Bld 150 (*)    Creatinine, Ser 1.11 (*)    GFR, Estimated 50 (*)    All other components within normal limits  CBC  PROTIME-INR     EKG: EKG Interpretation Date/Time:  Thursday December 26 2023 16:37:23 EDT Ventricular Rate:  90 PR Interval:  160 QRS Duration:  142 QT Interval:  398 QTC Calculation: 486 R Axis:   60  Text Interpretation: Normal sinus rhythm Right bundle branch block Abnormal ECG Confirmed by Garrick Charleston [phone]) on 12/26/2023 5:08:49 PM  Radiology: CT CERVICAL SPINE WO CONTRAST Result Date: 12/26/2023 CLINICAL DATA:  Status post fall. EXAM: CT CERVICAL SPINE WITHOUT CONTRAST TECHNIQUE: Multidetector CT imaging of the cervical spine was performed without intravenous contrast. Multiplanar CT image reconstructions were also generated. RADIATION DOSE REDUCTION: This exam was performed according to the departmental dose-optimization program which includes automated exposure control, adjustment of the mA and/or kV according to patient size and/or use of iterative reconstruction technique. COMPARISON:  None Available. FINDINGS: Alignment: There is mild straightening of the normal cervical spine lordosis. Skull base and vertebrae: No acute fracture. No primary bone lesion or focal pathologic process. Soft tissues and spinal canal: No prevertebral fluid or swelling. No visible canal hematoma. Disc levels: Marked severity multilevel endplate sclerosis is seen. This is most prominent at the levels of C4-C5, C5-C6 and C6-C7. Marked severity anterior osteophyte formation and posterior bony spurring are also seen at these levels. Moderate to marked severity intervertebral disc space narrowing is seen at C5-C6 and C6-C7, with mild intervertebral disc space narrowing present throughout the remainder of the cervical spine. Bilateral marked severity multilevel facet joint hypertrophy is noted. Upper chest: Negative. Other: None. IMPRESSION: 1. No acute fracture or subluxation in the cervical spine. 2. Marked severity multilevel degenerative changes, most prominent at the levels of C4-C5, C5-C6 and C6-C7. Electronically Signed    By: Suzen Dials M.D.   On: 12/26/2023 18:33   CT MAXILLOFACIAL WO CONTRAST Result Date: 12/26/2023 CLINICAL DATA:  Status post fall. EXAM: CT MAXILLOFACIAL WITHOUT CONTRAST TECHNIQUE: Multidetector CT imaging of the maxillofacial structures was performed. Multiplanar CT image reconstructions were also generated. RADIATION DOSE REDUCTION: This exam was performed according to the departmental dose-optimization program which includes automated exposure control, adjustment of the mA and/or kV according to patient size and/or use of iterative reconstruction technique. COMPARISON:  None Available. FINDINGS: Osseous: No fracture or mandibular dislocation. No destructive process. Orbits: Negative. No traumatic or inflammatory finding. Sinuses: Bilateral maxillary sinus polyps versus mucous retention cysts are present. Soft tissues: Mild soft tissue swelling is seen along the tip and bridge of the nasal bone. Moderate severity right frontal scalp soft tissue swelling is seen with an associated scalp hematoma. Limited intracranial: No significant or unexpected finding. IMPRESSION: 1. No acute facial bone fracture.  2. Moderate severity right frontal scalp soft tissue swelling with an associated scalp hematoma. 3. Bilateral maxillary sinus polyps versus mucous retention cysts. Electronically Signed   By: Suzen Dials M.D.   On: 12/26/2023 18:30   CT HEAD WO CONTRAST Result Date: 12/26/2023 CLINICAL DATA:  Status post fall. EXAM: CT HEAD WITHOUT CONTRAST TECHNIQUE: Contiguous axial images were obtained from the base of the skull through the vertex without intravenous contrast. RADIATION DOSE REDUCTION: This exam was performed according to the departmental dose-optimization program which includes automated exposure control, adjustment of the mA and/or kV according to patient size and/or use of iterative reconstruction technique. COMPARISON:  None Available. FINDINGS: Brain: There is generalized cerebral atrophy  with widening of the extra-axial spaces and ventricular dilatation. There are areas of decreased attenuation within the white matter tracts of the supratentorial brain, consistent with microvascular disease changes. Vascular: Marked severity bilateral cavernous carotid artery calcification is noted. Skull: Normal. Negative for fracture or focal lesion. Sinuses/Orbits: A 1.8 cm right maxillary sinus polyp versus mucous retention cyst is seen. Smaller bilateral maxillary sinus polyps versus mucous retention cysts are also noted. Other: There is moderate severity right frontal scalp soft tissue swelling with an associated scalp hematoma. IMPRESSION: 1. Generalized cerebral atrophy with chronic white matter small vessel ischemic changes. 2. Moderate severity right frontal scalp soft tissue swelling with an associated scalp hematoma. 3. Bilateral maxillary sinus polyps versus mucous retention cysts. Electronically Signed   By: Suzen Dials M.D.   On: 12/26/2023 18:27     Procedures   Medications Ordered in the ED  sodium chloride  0.9 % bolus 1,000 mL (0 mLs Intravenous Stopped 12/26/23 1807)                                    Medical Decision Making Elderly female presents after episode of syncope in the context of standing upright, feeling lightheaded, then losing consciousness. Patient does have trauma, concern for musculoskeletal injuries versus intracranial hemorrhage.  Suspicion for dehydration, electrolyte abnormalities, arrhythmia both considerations. Cardiac monitoring started, labs CT ordered.  Amount and/or Complexity of Data Reviewed Independent Historian: EMS External Data Reviewed: notes. Labs: ordered. Decision-making details documented in ED Course. Radiology: ordered and independent interpretation performed. Decision-making details documented in ED Course. ECG/medicine tests: ordered and independent interpretation performed. Decision-making details documented in ED  Course.  Risk Decision regarding hospitalization. Diagnosis or treatment significantly limited by social determinants of health.   7:20 PM On repeat exam patient is accompanied by her husband.  We discussed all findings, results, CT imaging head, neck, face, generally reassuring, no hemorrhage, fracture, though there is demonstration of extracranial lesions.  Patient's labs consistent with mild dehydration otherwise reassuring.  She has received fluids here and has had no additional episodes, has had no arrhythmia on monitor for hours, comfortable with discharge, discharged to follow-up with primary care.     Final diagnoses:  Fall, initial encounter  Injury of head, initial encounter  Contusion of face, initial encounter  Dehydration    ED Discharge Orders     None          Garrick Charleston, MD 12/26/23 1921
